# Patient Record
Sex: Male | Born: 1950 | Race: White | Hispanic: No | Marital: Married | Smoking: Never smoker
Health system: Southern US, Community
[De-identification: ages and names within clinical notes are randomized; demographics above are authoritative.]

## PROBLEM LIST (undated history)

## (undated) DIAGNOSIS — I10 Essential (primary) hypertension: Secondary | ICD-10-CM

## (undated) DIAGNOSIS — U071 COVID-19: Secondary | ICD-10-CM

## (undated) DIAGNOSIS — Z87442 Personal history of urinary calculi: Secondary | ICD-10-CM

## (undated) DIAGNOSIS — K2270 Barrett's esophagus without dysplasia: Secondary | ICD-10-CM

## (undated) HISTORY — DX: Barrett's esophagus without dysplasia: K22.70

## (undated) HISTORY — PX: HERNIA REPAIR: SHX51

---

## 1996-12-31 HISTORY — PX: BACK SURGERY: SHX140

## 2002-02-15 ENCOUNTER — Emergency Department (HOSPITAL_COMMUNITY): Admission: EM | Admit: 2002-02-15 | Discharge: 2002-02-15 | Payer: Self-pay | Admitting: Emergency Medicine

## 2003-08-14 ENCOUNTER — Emergency Department (HOSPITAL_COMMUNITY): Admission: EM | Admit: 2003-08-14 | Discharge: 2003-08-14 | Payer: Self-pay | Admitting: Emergency Medicine

## 2003-08-16 ENCOUNTER — Ambulatory Visit (HOSPITAL_COMMUNITY): Admission: RE | Admit: 2003-08-16 | Discharge: 2003-08-16 | Payer: Self-pay | Admitting: Internal Medicine

## 2006-05-24 ENCOUNTER — Emergency Department (HOSPITAL_COMMUNITY): Admission: EM | Admit: 2006-05-24 | Discharge: 2006-05-24 | Payer: Self-pay | Admitting: Emergency Medicine

## 2007-04-30 ENCOUNTER — Ambulatory Visit: Payer: Self-pay | Admitting: Internal Medicine

## 2007-05-19 ENCOUNTER — Encounter: Payer: Self-pay | Admitting: Internal Medicine

## 2007-05-19 ENCOUNTER — Ambulatory Visit: Payer: Self-pay | Admitting: Internal Medicine

## 2007-05-19 ENCOUNTER — Ambulatory Visit (HOSPITAL_COMMUNITY): Admission: RE | Admit: 2007-05-19 | Discharge: 2007-05-19 | Payer: Self-pay | Admitting: Internal Medicine

## 2009-07-24 ENCOUNTER — Emergency Department (HOSPITAL_COMMUNITY): Admission: EM | Admit: 2009-07-24 | Discharge: 2009-07-24 | Payer: Self-pay | Admitting: Emergency Medicine

## 2011-05-18 NOTE — Op Note (Signed)
NAME:  Cesar Harris, Cesar Harris                ACCOUNT NO.:  [id number]   MEDICAL RECORD NO.:  [id number]          PATIENT TYPE:  AMB   LOCATION:  DAY                           FACILITY:  [facility]   PHYSICIAN:  R. Roetta Sessions, M.D. DATE OF BIRTH:  11/24/1951   DATE OF PROCEDURE:  05/19/2007  DATE OF DISCHARGE:                               OPERATIVE REPORT   PROCEDURE:  Colonoscopy, snare polypectomy, lesion ablation.   INDICATIONS FOR PROCEDURE:  The patient is a 60 year old gentleman sent  over at the courtesy of Dr.  __________, for colorectal cancer  screening.  He has no lower GI tract symptoms.  He has never had his  lower GI tract evaluated.  There is no family history of colorectal  neoplasia.  Colonoscopy is now being done.  This was discussed with the  patient at length.  Potential risks, benefits, and alternatives have  been reviewed.  Questions were answered and he was agreeable.  Please  see documentation in medical record.   PROCEDURE NOTE:  O2 saturation, blood pressure, pulse oximetry monitored  throughout the entire procedure.  Conscious sedation with versed 3 mg  IV, Demerol 75 mg IV in divided doses.   INSTRUMENT:  Pentax video chip system.   FINDINGS:  Digital rectal exam revealed no abnormalities.   ENDOSCOPIC FINDINGS:  Prep was adequate.   The colonic mucosa was surveyed from the rectosigmoid junction to the  left transverse and right colon, the appendiceal orifice, the ileocecal  valve, and cecum.  These structures were well seen and photographed for  the record from the ___________ mucosal surfaces were seen.  The patient  had an 8-mm polyp on a stalk at the hepatic flexure; it was hot snared  and recovered with the scope.  There was a 3-mm mid descending colon  polyp which was ablated with the tip of the hot snare cautery.  There  was a 1.25-cm polyp at 25 cm (rectosigmoid) which was removed with not  snare and recovered with the scope.  Finally, there was a 5-mm  rectal  polyp seen at 5 cm from the anal verge which was hot snared.  The  remainder of the rectal mucosa included a retroflexed view of the anal  verge that demonstrated no abnormalities.  The remainder of the colonic  mucosa appeared normal.  The patient tolerated the procedure well and  was reacted from endoscopy.   IMPRESSION:  Multiple rectal and colonic polyps, destroyed and/or  removed as described above.   RECOMMENDATIONS:  1. No aspirin or antiinflammatory medications for 10 days.  2. Follow up on path.  3. Further recommendations to follow.      Jonathon Bellows, M.D.  Electronically Signed     RMR/MEDQ  D:  05/19/2007  T:  05/19/2007  Job:  [id number]   cc:   __________, MD

## 2011-05-18 NOTE — Op Note (Signed)
NAME:  Cesar Harris, Cesar Harris                          ACCOUNT NO.:  [id number]   MEDICAL RECORD NO.:  [id number]                   PATIENT TYPE:  AMB   LOCATION:  DAY                                  FACILITY:  [facility]   PHYSICIAN:  R. Roetta Sessions, M.D.              DATE OF BIRTH:  10-21-1951   DATE OF PROCEDURE:  DATE OF DISCHARGE:                                 OPERATIVE REPORT   PROCEDURE:  Esophagogastroduodenoscopy with Maloney dilation.   ENDOSCOPIST:  Gerrit Friends. Rourk, M.D.   INDICATIONS FOR PROCEDURE:  The patient is a 60 year old gentleman with  longstanding frequent reflux symptoms he describes as heartburn with a  several month history of present illness intermittent esophageal dysphagia  to solids.  The frequency of dysphagia episodes have increased over the past  couple of months, in fact, he felt that he had a piece of food stuck in his  esophagus on August 14, 2003.  He presented to the emergency department,  here, and saw Dr. Rosalia Hammers.  Dr. Rosalia Hammers evaluated him in the emergency department.  The foreign body sensation passed spontaneously.  She called me to set him  up for further evaluation.  EGD is now being done.  The potential risks,  benefits, and alternatives have been reviewed. The potential for esophageal  dilation has been discussed; questions answered.  Please see my handwritten  H&P.   PROCEDURE NOTE:  O2 saturation, blood pressure, pulse and respirations were  monitored throughout the entirety procedure.  Conscious sedation: Versed 3  mg IV, Demerol 75 mg IV in divided doses, Cetacaine spray for topical  oropharyngeal anesthesia.   INSTRUMENT:  Olympus video chip adult gastroscope.   FINDINGS:  Examination of the tubular esophagus revealed a Schatzki ring.  Also there were multiple esophageal erosions coming up in a geographic  distribution involving the distal one-third of the tubular esophagus.  There was no evidence of Barrett's esophagus or cancer.  The EG  junction was  easily traversed.   STOMACH:  The gastric cavity was empty.  It insufflated well with air.  A  thorough examination of the gastric mucosa including a retroflex view of the  proximal stomach and esophagogastric junction demonstrated only a small  hiatal hernia and a couple of tiny antral erosions.  The pylorus was patent  and easily traversed.   Examination of the bulb and the second portion revealed no abnormalities.   THERAPEUTIC/DIAGNOSTIC MANEUVERS:  A 56 French Maloney dilator was passed to  full insertion with good patient tolerance and without blood return on the  dilator.  A look back revealed the ring had been ruptured without apparent  complication.   The patient tolerated the procedure well and was reacted in endoscopy.   IMPRESSION:  1. Multiple distal esophageal erosions as described above, consistent with     moderately severe erosive reflux esophagitis.  2. Schatzki's ring status post dilation as  described above.  3. Small hiatal hernia.  4. Tiny antral erosions of doubtful clinical significance.  The remainder of     the gastric mucosa and D1 and D2 appeared normal.   RECOMMENDATIONS:  1. Antireflux measures emphasized.  2. Gastroesophageal reflux disease literature provided to the patient.  3. Begin Aciphex 20 mg orally daily, each morning before breakfast.   PLAN:  Plan to see him back in the office in 4 weeks to assess his progress.                                               Cesar Harris, M.D.    RMR/MEDQ  D:  08/16/2003  T:  08/16/2003  Job:  [id number]   cc:   Hilario Quarry, M.D.  [address]  [place]  [place] [address]  Fax: [phone]

## 2011-05-18 NOTE — Consult Note (Signed)
NAME:  Cesar Harris, Cesar Harris                ACCOUNT NO.:  [id number]   MEDICAL RECORD NO.:  [id number]          PATIENT TYPE:  AMB   LOCATION:                                FACILITY:  [facility]   PHYSICIAN:  R. Roetta Sessions, M.D. DATE OF BIRTH:  07-Jul-1951   DATE OF CONSULTATION:  DATE OF DISCHARGE:                                 CONSULTATION   CHIEF COMPLAINT:  Screening colonoscopy.   HISTORY OF PRESENT ILLNESS:  Cesar Harris is a 60 year old Caucasian male.  He has never had a screening colonoscopy.  He presents today without any  GI complaints for screening exam.  He has history of chronic GERD and  dysphagia, followed by Cesar Harris, and had EGD in 2004 which was shown to  have distal esophageal erosions and small hiatal hernia and a Schatzki's  Ring which was dilated.  He has done well since that time.  No further  GERD symptoms, and he has not been on PPI.  Denies any dysphagia,  odynophagia, anorexia or early satiety.  Denies any rectal bleeding or  melena.  Denies any abdominal pain.  Denies any change in bowel habits.   PAST MEDICAL HISTORY:  1. Hypertension.  2. Bilateral inguinal hernia repairs.  3. L4-5 disk surgery.  4. Erosive reflux esophagitis found on EGD along with a small hiatal      hernia and Schatzki's Ring by Cesar Harris on August 16, 2003.   CURRENT MEDICATIONS:  1. K-Ciel 40 mEq daily.  2. Indomethacin 1-2 t.i.d.  3  Amlodipine/benazepril 5/10 mg daily.  1. Hydrochlorothiazide 25 mg daily.  2. Goody's p.r.n.   ALLERGIES:  No known drug allergies.   FAMILY HISTORY:  There is no known family history of colorectal  carcinoma, liver or chronic GI problems.   SOCIAL HISTORY:  Cesar Harris is married.  He has two grown healthy  children.  He is employed with [employer].  He denies tobacco, alcohol or  drug use.   REVIEW OF SYSTEMS:  CONSTITUTIONAL:  Weight stable.  Denies any fatigue.  CARDIOVASCULAR:  Denies any chest pain or palpitations. PULMONOLOGY:  Denies any  cough, shortness of breath, dyspnea or hemoptysis.  GI:  See  HPI.   PHYSICAL EXAMINATION:  VITAL SIGNS:  Weight 231 pounds, height 72  inches, temperature 97.7, blood pressure 110/70, pulse 78.  GENERAL:  Cesar Harris is a well-developed, well-nourished, obese,  Caucasian male in no acute distress.  HEENT:  Sclerae are clear, nonicteric.  Conjunctivae are pink.  Oropharynx pink and moist without any lesions.  NECK:  Supple without any mass or thyromegaly.  CHEST:  Heart regular rate and rhythm with normal S1, S2 without any  murmurs, clicks, rubs or gallops.  LUNGS:  Clear to auscultation bilaterally.  ABDOMEN:  Positive bowel sounds x4, no bruits auscultated, soft,  nontender, nondistended with no palpable mass or hepatosplenomegaly.  No  rebound tenderness or guarding.  EXTREMITIES:  No clubbing or edema.  SKIN:  Pink, warm and dry without any rash or jaundice.   IMPRESSION:  Cesar Harris is a 60 year old Caucasian male who  is here to  accept screening colonoscopy.  He denies any GI complaints at this time.  He also has history of erosive reflux esophagitis and Schatzki's Ring,  has done well since Cesar Harris dilatation in 2004 by Cesar Harris.  He is  currently not on PPI.   PLAN:  Screening colonoscopy with Dr.  Jena Harris in the near future.  I  discussed the procedure including risks and benefits including but not  limited to bleeding, infection, perforation, drug reaction.  He agrees  with plan and consent will be obtained.   We would like to thank Dr. Regino Schultze for allowing [place] to participate in the  care of Cesar Harris.      Nicholas Lose, N.P.      Jonathon Bellows, M.D.  Electronically Signed    KC/MEDQ  D:  04/30/2007  T:  04/30/2007  Job:  [id number]

## 2012-03-25 ENCOUNTER — Encounter (HOSPITAL_COMMUNITY): Payer: Self-pay

## 2012-03-27 ENCOUNTER — Encounter (HOSPITAL_COMMUNITY)
Admission: RE | Admit: 2012-03-27 | Discharge: 2012-03-27 | Disposition: A | Discharge status: Home / Self Care | Payer: BC Managed Care – PPO | Source: Ambulatory Visit | Attending: Ophthalmology | Admitting: Ophthalmology

## 2012-03-27 ENCOUNTER — Other Ambulatory Visit: Payer: Self-pay

## 2012-03-27 ENCOUNTER — Encounter (HOSPITAL_COMMUNITY): Payer: Self-pay

## 2012-03-27 HISTORY — DX: Essential (primary) hypertension: I10

## 2012-03-27 LAB — BASIC METABOLIC PANEL
BUN: 20 mg/dL (ref 6–23)
Chloride: 105 mEq/L (ref 96–112)
Creatinine, Ser: 1.13 mg/dL (ref 0.50–1.35)
GFR calc non Af Amer: 68 mL/min — ABNORMAL LOW (ref >90)
Sodium: 138 mEq/L (ref 135–145)

## 2012-03-27 NOTE — Patient Instructions (Signed)
20 Cesar Harris  03/27/2012   Your procedure is scheduled on:  Thursday, 04/03/12  Report to [facility] at 0940 AM.  Call this number if you have problems the morning of surgery: [phone]   Remember:   Do not eat food:After Midnight.  May have clear liquids:until Midnight .  Clear liquids include soda, tea, black coffee, apple or grape juice, broth.  Take these medicines the morning of surgery with A SIP OF WATER: lotrel   Do not wear jewelry, make-up or nail polish.  Do not wear lotions, powders, or perfumes. You may wear deodorant.  Do not bring valuables to the hospital.  Contacts, dentures or bridgework may not be worn into surgery.     Patients discharged the day of surgery will not be allowed to drive home.  Name and phone number of your driver: driver  Special Instructions: Use eye drops as instructed.   Please read over the following fact sheets that you were given: Pain Booklet, Anesthesia Post-op Instructions and Care and Recovery After Surgery   PATIENT INSTRUCTIONS POST-ANESTHESIA  IMMEDIATELY FOLLOWING SURGERY:  Do not drive or operate machinery for the first twenty four hours after surgery.  Do not make any important decisions for twenty four hours after surgery or while taking narcotic pain medications or sedatives.  If you develop intractable nausea and vomiting or a severe headache please notify your doctor immediately.  FOLLOW-UP:  Please make an appointment with your surgeon as instructed. You do not need to follow up with anesthesia unless specifically instructed to do so.  WOUND CARE INSTRUCTIONS (if applicable):  Keep a dry clean dressing on the anesthesia/puncture wound site if there is drainage.  Once the wound has quit draining you may leave it open to air.  Generally you should leave the bandage intact for twenty four hours unless there is drainage.  If the epidural site drains for more than 36-48 hours please call the anesthesia department.  QUESTIONS?:   Please feel free to call your physician or the hospital operator if you have any questions, and they will be happy to assist you.         Cataract A cataract is a clouding of the lens of the eye. When a lens becomes cloudy, vision is reduced based on the degree and nature of the clouding. Many cataracts reduce vision to some degree. Some cataracts make people more near-sighted as they develop. Other cataracts increase glare. Cataracts that are ignored and become worse can sometimes look white. The white color can be seen through the pupil. CAUSES   Aging. However, cataracts may occur at any age, even in newborns.   Certain drugs.   Trauma to the eye.   Certain diseases such as diabetes.   Specific eye diseases such as chronic inflammation inside the eye or a sudden attack of a rare form of glaucoma.   Inherited or acquired medical problems.  SYMPTOMS   Gradual, progressive drop in vision in the affected eye.   Severe, rapid visual loss. This most often happens when trauma is the cause.  DIAGNOSIS  To detect a cataract, an eye doctor examines the lens. Cataracts are best diagnosed with an exam of the eyes with the pupils enlarged (dilated) by drops.  TREATMENT  For an early cataract, vision may improve by using different eyeglasses or stronger lighting. If that does not help your vision, surgery is the only effective treatment. A cataract needs to be surgically removed when vision loss interferes  with your everyday activities, such as driving, reading, or watching TV. A cataract may also have to be removed if it prevents examination or treatment of another eye problem. Surgery removes the cloudy lens and usually replaces it with a substitute lens (intraocular lens, IOL).  At a time when both you and your doctor agree, the cataract will be surgically removed. If you have cataracts in both eyes, only one is usually removed at a time. This allows the operated eye to heal and be out of  danger from any possible problems after surgery (such as infection or poor wound healing). In rare cases, a cataract may be doing damage to your eye. In these cases, your caregiver may advise surgical removal right away. The vast majority of people who have cataract surgery have better vision afterward. HOME CARE INSTRUCTIONS  If you are not planning surgery, you may be asked to do the following:  Use different eyeglasses.   Use stronger or brighter lighting.   Ask your eye doctor about reducing your medicine dose or changing medicines if it is thought that a medicine caused your cataract. Changing medicines does not make the cataract go away on its own.   Become familiar with your surroundings. Poor vision can lead to injury. Avoid bumping into things on the affected side. You are at a higher risk for tripping or falling.   Exercise extreme care when driving or operating machinery.   Wear sunglasses if you are sensitive to bright light or experiencing problems with glare.  SEEK IMMEDIATE MEDICAL CARE IF:   You have a worsening or sudden vision loss.   You notice redness, swelling, or increasing pain in the eye.   You have a fever.  Document Released: 12/17/2005 Document Revised: 12/06/2011 Document Reviewed: 08/10/2011 [facility] Patient Information 2012 [place], [place].

## 2012-04-02 MED ORDER — TETRACAINE HCL 0.5 % OP SOLN
OPHTHALMIC | Status: AC
  Filled 2012-04-02: qty 2

## 2012-04-02 MED ORDER — NEOMYCIN-POLYMYXIN-DEXAMETH 3.5-10000-0.1 OP OINT
TOPICAL_OINTMENT | OPHTHALMIC | Status: AC
  Filled 2012-04-02: qty 3.5

## 2012-04-02 MED ORDER — LIDOCAINE HCL (PF) 1 % IJ SOLN
INTRAMUSCULAR | Status: AC
  Filled 2012-04-02: qty 2

## 2012-04-02 MED ORDER — CYCLOPENTOLATE-PHENYLEPHRINE 0.2-1 % OP SOLN
OPHTHALMIC | Status: AC
  Filled 2012-04-02: qty 2

## 2012-04-02 MED ORDER — LIDOCAINE HCL 3.5 % OP GEL
OPHTHALMIC | Status: AC
  Filled 2012-04-02: qty 5

## 2012-04-03 ENCOUNTER — Encounter (HOSPITAL_COMMUNITY)
Admission: RE | Disposition: A | Discharge status: Home / Self Care | Payer: Self-pay | Source: Ambulatory Visit | Attending: Ophthalmology

## 2012-04-03 ENCOUNTER — Ambulatory Visit (HOSPITAL_COMMUNITY)
Admission: RE | Admit: 2012-04-03 | Discharge: 2012-04-03 | Disposition: A | Discharge status: Home / Self Care | Payer: BC Managed Care – PPO | Source: Ambulatory Visit | Attending: Ophthalmology | Admitting: Ophthalmology

## 2012-04-03 ENCOUNTER — Encounter (HOSPITAL_COMMUNITY): Payer: Self-pay | Admitting: Anesthesiology

## 2012-04-03 ENCOUNTER — Ambulatory Visit (HOSPITAL_COMMUNITY): Payer: BC Managed Care – PPO | Admitting: Anesthesiology

## 2012-04-03 ENCOUNTER — Encounter (HOSPITAL_COMMUNITY): Payer: Self-pay | Admitting: *Deleted

## 2012-04-03 DIAGNOSIS — IMO0002 Reserved for concepts with insufficient information to code with codable children: Secondary | ICD-10-CM | POA: Insufficient documentation

## 2012-04-03 DIAGNOSIS — Z79899 Other long term (current) drug therapy: Secondary | ICD-10-CM | POA: Insufficient documentation

## 2012-04-03 DIAGNOSIS — I10 Essential (primary) hypertension: Secondary | ICD-10-CM | POA: Insufficient documentation

## 2012-04-03 HISTORY — PX: CATARACT EXTRACTION W/PHACO: SHX586

## 2012-04-03 SURGERY — PHACOEMULSIFICATION, CATARACT, WITH IOL INSERTION
Anesthesia: Monitor Anesthesia Care | Site: Eye | Laterality: Left | Wound class: Clean

## 2012-04-03 MED ORDER — POVIDONE-IODINE 5 % OP SOLN
OPHTHALMIC | Status: DC | PRN
  Administered 2012-04-03: 1 via OPHTHALMIC

## 2012-04-03 MED ORDER — LACTATED RINGERS IV SOLN
INTRAVENOUS | Status: DC
  Administered 2012-04-03: 11:00:00 via INTRAVENOUS

## 2012-04-03 MED ORDER — LIDOCAINE 3.5 % OP GEL OPTIME - NO CHARGE
OPHTHALMIC | Status: DC | PRN
  Administered 2012-04-03: 1 [drp] via OPHTHALMIC

## 2012-04-03 MED ORDER — PROVISC 10 MG/ML IO SOLN
INTRAOCULAR | Status: DC | PRN
  Administered 2012-04-03: .85 mL via INTRAOCULAR

## 2012-04-03 MED ORDER — LIDOCAINE HCL (PF) 1 % IJ SOLN
INTRAMUSCULAR | Status: DC | PRN
  Administered 2012-04-03: .6 mL

## 2012-04-03 MED ORDER — ONDANSETRON HCL 4 MG/2ML IJ SOLN: 4.0000 mg | Freq: Once | INTRAMUSCULAR | Status: DC | PRN

## 2012-04-03 MED ORDER — NEOMYCIN-POLYMYXIN-DEXAMETH 0.1 % OP OINT
TOPICAL_OINTMENT | OPHTHALMIC | Status: DC | PRN
  Administered 2012-04-03: 1 via OPHTHALMIC

## 2012-04-03 MED ORDER — BSS IO SOLN
INTRAOCULAR | Status: DC | PRN
  Administered 2012-04-03: 15 mL via INTRAOCULAR

## 2012-04-03 MED ORDER — MIDAZOLAM HCL 2 MG/2ML IJ SOLN
1.0000 mg | INTRAMUSCULAR | Status: DC | PRN
  Administered 2012-04-03: 2 mg via INTRAVENOUS

## 2012-04-03 MED ORDER — EPINEPHRINE HCL 1 MG/ML IJ SOLN
INTRAMUSCULAR | Status: AC
  Filled 2012-04-03: qty 1

## 2012-04-03 MED ORDER — PHENYLEPHRINE HCL 2.5 % OP SOLN
1.0000 [drp] | OPHTHALMIC | Status: AC
  Administered 2012-04-03 (×3): 1 [drp] via OPHTHALMIC

## 2012-04-03 MED ORDER — EPINEPHRINE HCL 1 MG/ML IJ SOLN
INTRAOCULAR | Status: DC | PRN
  Administered 2012-04-03: 12:00:00

## 2012-04-03 MED ORDER — MIDAZOLAM HCL 2 MG/2ML IJ SOLN
INTRAMUSCULAR | Status: AC
  Filled 2012-04-03: qty 2

## 2012-04-03 MED ORDER — FENTANYL CITRATE 0.05 MG/ML IJ SOLN: 25.0000 ug | INTRAMUSCULAR | Status: DC | PRN

## 2012-04-03 MED ORDER — LIDOCAINE HCL 3.5 % OP GEL: 1.0000 "application " | Freq: Once | OPHTHALMIC | Status: DC

## 2012-04-03 MED ORDER — TRYPAN BLUE 0.06 % OP SOLN
OPHTHALMIC | Status: AC
  Filled 2012-04-03: qty 0.5

## 2012-04-03 MED ORDER — PHENYLEPHRINE HCL 2.5 % OP SOLN
OPHTHALMIC | Status: AC
  Filled 2012-04-03: qty 2

## 2012-04-03 MED ORDER — TETRACAINE HCL 0.5 % OP SOLN
1.0000 [drp] | OPHTHALMIC | Status: AC
  Administered 2012-04-03 (×3): 1 [drp] via OPHTHALMIC

## 2012-04-03 MED ORDER — CYCLOPENTOLATE-PHENYLEPHRINE 0.2-1 % OP SOLN
1.0000 [drp] | OPHTHALMIC | Status: AC
  Administered 2012-04-03 (×3): 1 [drp] via OPHTHALMIC

## 2012-04-03 SURGICAL SUPPLY — 32 items

## 2012-04-03 NOTE — Discharge Instructions (Signed)
LYRIQ JARCHOW  04/03/2012     Instructions  1. Use medications as Instructed.  Shake well before use. Wait 5 minutes between drops.  {OPHTHALMIC ANTIBIOTICS:22167} 4 times a day x 1 week.  {OPHTHALMIC ANTI-INFLAMMATORY:22168} 2 times a day x 4 weeks.  {OPHTHALMIC STEROID:22169} 4 times a day - week 1   3 times a day - Week 2, 2 times a day- Week 3, 1 time a day - Week 4.  2. Do not rub the operative eye. Do not swim underwater for 2 weeks.  3. You may remove the clear shield and resume your normal activities the day after  Surgery. Your eyes may feel more comfortable if you wear dark glasses outside.  4. Call our office at [phone] if you have sudden change in vision, extreme redness or pain. Some fluctuation in vision is normal after surgery. If you have an emergency after hours, call Dr. Alto Denver at [phone].  5. It is important that you attend all of your follow-up appointments.        Follow-up:{follow up:32580} with Gemma Payor, MD.   Dr. Lahoma Crocker: [phone]  Dr. Lita Mains: [phone]  Dr. Alto Denver: [phone]   If you find that you cannot contact your physician, but feel that your signs and   Symptoms warrant a physician's attention, call the Emergency Room at   [phone].   Other{NA AND WNUUVOZD:66440}.

## 2012-04-03 NOTE — Transfer of Care (Signed)
Immediate Anesthesia Transfer of Care Note  Patient: Cesar Harris  Procedure(s) Performed: Procedure(s) (LRB): CATARACT EXTRACTION PHACO AND INTRAOCULAR LENS PLACEMENT (IOC) (Left)  Patient Location: PACU and Short Stay  Anesthesia Type: MAC  Level of Consciousness: awake, alert  and oriented  Airway & Oxygen Therapy: Patient Spontanous Breathing  Post-op Assessment: Report given to PACU RN, Post -op Vital signs reviewed and stable and Patient moving all extremities  Post vital signs: Reviewed and stable  Complications: No apparent anesthesia complications

## 2012-04-03 NOTE — Anesthesia Postprocedure Evaluation (Signed)
  Anesthesia Post-op Note  Patient: Cesar Harris  Procedure(s) Performed: Procedure(s) (LRB): CATARACT EXTRACTION PHACO AND INTRAOCULAR LENS PLACEMENT (IOC) (Left)  Patient Location: PACU and Short Stay  Anesthesia Type: MAC  Level of Consciousness: awake, alert , oriented and patient cooperative  Airway and Oxygen Therapy: Patient Spontanous Breathing  Post-op Pain: none  Post-op Assessment: Post-op Vital signs reviewed, Patient's Cardiovascular Status Stable, Respiratory Function Stable, Patent Airway and No signs of Nausea or vomiting  Post-op Vital Signs: Reviewed and stable  Complications: No apparent anesthesia complications

## 2012-04-03 NOTE — H&P (Signed)
I have reviewed the H&P, the patient was re-examined, and I have identified no interval changes in medical condition and plan of care since the history and physical of record  

## 2012-04-03 NOTE — Op Note (Signed)
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  OPERATION PERFORMED:  Phacoemulsification with posterior chamber intraocular lens implantation, left eye.  SURGEON:  Bonne Dolores. Delbert Vu, MD  ANESTHESIA:  General endotracheal anesthesia.  OPERATIVE SUMMARY:  In the preoperative area, dilating drops were placed into the left eye.  The patient was then brought into the operating room where he was placed under general anesthesia.  The eye was then prepped and draped.  Beginning with a 75 blade, a paracentesis port was made at the surgeon's 2 o'clock position.  The anterior chamber was then filled with a 1% nonpreserved lidocaine solution with epinephrine.  This was followed by Viscoat to deepen the chamber.  A small fornix-based peritomy was performed superiorly.  Next, a single iris hook was placed through the limbus superiorly.  A 2.4-mm keratome blade was then used to make a clear corneal incision over the iris hook.  A bent cystotome needle and Utrata forceps were used to create a continuous tear capsulotomy.  Hydrodissection was performed using balanced salt solution on a fine cannula.  The lens nucleus was then removed using phacoemulsification in a quadrant cracking technique.  The cortical material was then removed with irrigation and aspiration.  The capsular bag and anterior chamber were refilled with Provisc.  The wound was widened to approximately 3 mm and a posterior chamber intraocular lens was placed into the capsular  bag without difficulty using an [employer] lens injecting system.  A single 10-0 nylon suture was then used to close the incision as well as stromal hydration.  The Provisc was removed from the anterior chamber and capsular bag with irrigation and aspiration.  At this point, the wounds were tested for leak, which were negative.  The anterior chamber remained deep and stable.  The patient tolerated the procedure well.  There were no operative complications, and he awoke from general anesthesia without problem.  No surgical specimens.  Prosthetic device used is a Cabin crew posterior chamber lens, model MX60, power of 20.5, serial number is [id number].          ______________________________ Susanne Greenhouse, MD     KEH/MEDQ  D:  04/03/2012  T:  04/03/2012  Job:  [id number]

## 2012-04-03 NOTE — OR Nursing (Signed)
H & H not performed by lab due to clot.  Per Dr. Jayme Cloud don,t repeat H&H

## 2012-04-03 NOTE — Brief Op Note (Signed)
Pre-Op Dx: Cataract OS Post-Op Dx: Cataract OS Surgeon: Tarrance Januszewski Anesthesia: Topical with MAC Implant: Lenstec, Model Softec HD Specimen: None Complications: None 

## 2012-04-03 NOTE — Anesthesia Preprocedure Evaluation (Addendum)
Anesthesia Evaluation  Patient identified by MRN, date of birth, ID band Patient awake    Reviewed: Allergy & Precautions, H&P , NPO status , Patient's Chart, lab work & pertinent test results  Airway Mallampati: I      Dental  (+) Teeth Intact   Pulmonary  breath sounds clear to auscultation        Cardiovascular hypertension, Pt. on medications Rhythm:Regular Rate:Normal     Neuro/Psych    GI/Hepatic   Endo/Other    Renal/GU Renal disease (stones, hx Gout)     Musculoskeletal   Abdominal   Peds  Hematology   Anesthesia Other Findings   Reproductive/Obstetrics                           Anesthesia Physical Anesthesia Plan  ASA: II  Anesthesia Plan: MAC   Post-op Pain Management:    Induction: Intravenous  Airway Management Planned: Nasal Cannula  Additional Equipment:   Intra-op Plan:   Post-operative Plan:   Informed Consent: I have reviewed the patients History and Physical, chart, labs and discussed the procedure including the risks, benefits and alternatives for the proposed anesthesia with the patient or authorized representative who has indicated his/her understanding and acceptance.     Plan Discussed with:   Anesthesia Plan Comments:         Anesthesia Quick Evaluation

## 2012-04-07 ENCOUNTER — Encounter (HOSPITAL_COMMUNITY): Payer: Self-pay | Admitting: Ophthalmology

## 2015-11-07 NOTE — Patient Instructions (Signed)
Your procedure is scheduled on:  11/14/2015  Report to [facility] at    10:00  AM.  Call this number if you have problems the morning of surgery: [phone]   Remember:   Do not eat or drink :After Midnight.    Take these medicines the morning of surgery with A SIP OF WATER: Lotrel and indocin   Do not wear jewelry, make-up or nail polish.  Do not wear lotions, powders, or perfumes. You may wear deodorant.  Do not shave 48 hours prior to surgery.  Do not bring valuables to the hospital.  Contacts, dentures or bridgework may not be worn into surgery.  Patients discharged the day of surgery will not be allowed to drive home.  Name and phone number of your driver:    Please read over the following fact sheets that you were given: Pain Booklet, Surgical Site Infection Prevention, Anesthesia Post-op Instructions and Care and Recovery After Surgery  Cataract Surgery  A cataract is a clouding of the lens of the eye. When a lens becomes cloudy, vision is reduced based on the degree and nature of the clouding. Surgery may be needed to improve vision. Surgery removes the cloudy lens and usually replaces it with a substitute lens (intraocular lens, IOL). LET YOUR EYE DOCTOR KNOW ABOUT:  Allergies to food or medicine.   Medicines taken including herbs, eyedrops, over-the-counter medicines, and creams.   Use of steroids (by mouth or creams).   Previous problems with anesthetics or numbing medicine.   History of bleeding problems or blood clots.   Previous surgery.   Other health problems, including diabetes and kidney problems.   Possibility of pregnancy, if this applies.  RISKS AND COMPLICATIONS  Infection.   Inflammation of the eyeball (endophthalmitis) that can spread to both eyes (sympathetic ophthalmia).   Poor wound healing.   If an IOL is inserted, it can later fall out of proper position. This is very uncommon.   Clouding of the part of your eye that holds an IOL in  place. This is called an "after-cataract." These are uncommon, but easily treated.  BEFORE THE PROCEDURE  Do not eat or drink anything except small amounts of water for 8 to 12 before your surgery, or as directed by your caregiver.   Unless you are told otherwise, continue any eyedrops you have been prescribed.   Talk to your primary caregiver about all other medicines that you take (both prescription and non-prescription). In some cases, you may need to stop or change medicines near the time of your surgery. This is most important if you are taking blood-thinning medicine.Do not stop medicines unless you are told to do so.   Arrange for someone to drive you to and from the procedure.   Do not put contact lenses in either eye on the day of your surgery.  PROCEDURE There is more than one method for safely removing a cataract. Your doctor can explain the differences and help determine which is best for you. Phacoemulsification surgery is the most common form of cataract surgery.  An injection is given behind the eye or eyedrops are given to make this a painless procedure.   A small cut (incision) is made on the edge of the clear, dome-shaped surface that covers the front of the eye (cornea).   A tiny probe is painlessly inserted into the eye. This device gives off ultrasound waves that soften and break up the cloudy center of the lens. This makes it  easier for the cloudy lens to be removed by suction.   An IOL may be implanted.   The normal lens of the eye is covered by a clear capsule. Part of that capsule is intentionally left in the eye to support the IOL.   Your surgeon may or may not use stitches to close the incision.  There are other forms of cataract surgery that require a larger incision and stiches to close the eye. This approach is taken in cases where the doctor feels that the cataract cannot be easily removed using phacoemulsification. AFTER THE PROCEDURE  When an IOL is  implanted, it does not need care. It becomes a permanent part of your eye and cannot be seen or felt.   Your doctor will schedule follow-up exams to check on your progress.   Review your other medicines with your doctor to see which can be resumed after surgery.   Use eyedrops or take medicine as prescribed by your doctor.  Document Released: 12/06/2011 Document Reviewed: 12/03/2011 [facility] Patient Information 2012 [place].  .Cataract Surgery Care After Refer to this sheet in the next few weeks. These instructions provide you with information on caring for yourself after your procedure. Your caregiver may also give you more specific instructions. Your treatment has been planned according to current medical practices, but problems sometimes occur. Call your caregiver if you have any problems or questions after your procedure.  HOME CARE INSTRUCTIONS   Avoid strenuous activities as directed by your caregiver.   Ask your caregiver when you can resume driving.   Use eyedrops or other medicines to help healing and control pressure inside your eye as directed by your caregiver.   Only take over-the-counter or prescription medicines for pain, discomfort, or fever as directed by your caregiver.   Do not to touch or rub your eyes.   You may be instructed to use a protective shield during the first few days and nights after surgery. If not, wear sunglasses to protect your eyes. This is to protect the eye from pressure or from being accidentally bumped.   Keep the area around your eye clean and dry. Avoid swimming or allowing water to hit you directly in the face while showering. Keep soap and shampoo out of your eyes.   Do not bend or lift heavy objects. Bending increases pressure in the eye. You can walk, climb stairs, and do light household chores.   Do not put a contact lens into the eye that had surgery until your caregiver says it is okay to do so.   Ask your doctor when you can  return to work. This will depend on the kind of work that you do. If you work in a dusty environment, you may be advised to wear protective eyewear for a period of time.   Ask your caregiver when it will be safe to engage in sexual activity.   Continue with your regular eye exams as directed by your caregiver.  What to expect:  It is normal to feel itching and mild discomfort for a few days after cataract surgery. Some fluid discharge is also common, and your eye may be sensitive to light and touch.   After 1 to 2 days, even moderate discomfort should disappear. In most cases, healing will take about 6 weeks.   If you received an intraocular lens (IOL), you may notice that colors are very bright or have a blue tinge. Also, if you have been in bright sunlight, everything  may appear reddish for a few hours. If you see these color tinges, it is because your lens is clear and no longer cloudy. Within a few months after receiving an IOL, these extra colors should go away. When you have healed, you will probably need new glasses.  SEEK MEDICAL CARE IF:   You have increased bruising around your eye.   You have discomfort not helped by medicine.  SEEK IMMEDIATE MEDICAL CARE IF:   You have a fever.   You have a worsening or sudden vision loss.   You have redness, swelling, or increasing pain in the eye.   You have a thick discharge from the eye that had surgery.  MAKE SURE YOU:  Understand these instructions.   Will watch your condition.   Will get help right away if you are not doing well or get worse.  Document Released: 07/06/2005 Document Revised: 12/06/2011 Document Reviewed: 08/10/2011 [facility] Patient Information 2012 [place].    Monitored Anesthesia Care  Monitored anesthesia care is an anesthesia service for a medical procedure. Anesthesia is the loss of the ability to feel pain. It is produced by medications called anesthetics. It may affect a small area of your body  (local anesthesia), a large area of your body (regional anesthesia), or your entire body (general anesthesia). The need for monitored anesthesia care depends your procedure, your condition, and the potential need for regional or general anesthesia. It is often provided during procedures where:   General anesthesia may be needed if there are complications. This is because you need special care when you are under general anesthesia.   You will be under local or regional anesthesia. This is so that you are able to have higher levels of anesthesia if needed.   You will receive calming medications (sedatives). This is especially the case if sedatives are given to put you in a semi-conscious state of relaxation (deep sedation). This is because the amount of sedative needed to produce this state can be hard to predict. Too much of a sedative can produce general anesthesia. Monitored anesthesia care is performed by one or more caregivers who have special training in all types of anesthesia. You will need to meet with these caregivers before your procedure. During this meeting, they will ask you about your medical history. They will also give you instructions to follow. (For example, you will need to stop eating and drinking before your procedure. You may also need to stop or change medications you are taking.) During your procedure, your caregivers will stay with you. They will:   Watch your condition. This includes watching you blood pressure, breathing, and level of pain.   Diagnose and treat problems that occur.   Give medications if they are needed. These may include calming medications (sedatives) and anesthetics.   Make sure you are comfortable.  Having monitored anesthesia care does not necessarily mean that you will be under anesthesia. It does mean that your caregivers will be able to manage anesthesia if you need it or if it occurs. It also means that you will be able to have a different type  of anesthesia than you are having if you need it. When your procedure is complete, your caregivers will continue to watch your condition. They will make sure any medications wear off before you are allowed to go home.  Document Released: 09/12/2005 Document Revised: 04/13/2013 Document Reviewed: 01/28/2013 [facility] Patient Information 2014 [place], [place].

## 2015-11-08 ENCOUNTER — Encounter (HOSPITAL_COMMUNITY): Payer: Self-pay

## 2015-11-08 ENCOUNTER — Encounter (HOSPITAL_COMMUNITY)
Admission: RE | Admit: 2015-11-08 | Discharge: 2015-11-08 | Disposition: A | Discharge status: Home / Self Care | Payer: Medicare Other | Source: Ambulatory Visit | Attending: Ophthalmology | Admitting: Ophthalmology

## 2015-11-08 DIAGNOSIS — Z01818 Encounter for other preprocedural examination: Secondary | ICD-10-CM | POA: Insufficient documentation

## 2015-11-08 DIAGNOSIS — H2511 Age-related nuclear cataract, right eye: Secondary | ICD-10-CM | POA: Diagnosis not present

## 2015-11-08 HISTORY — DX: Personal history of urinary calculi: Z87.442

## 2015-11-08 LAB — CBC
HEMATOCRIT: 46.3 % (ref 39.0–52.0)
Hemoglobin: 15.7 g/dL (ref 13.0–17.0)
MCH: 28.9 pg (ref 26.0–34.0)
MCHC: 33.9 g/dL (ref 30.0–36.0)
MCV: 85.3 fL (ref 78.0–100.0)
Platelets: 180 10*3/uL (ref 150–400)
RBC: 5.43 MIL/uL (ref 4.22–5.81)
RDW: 12.9 % (ref 11.5–15.5)
WBC: 7.5 10*3/uL (ref 4.0–10.5)

## 2015-11-08 LAB — BASIC METABOLIC PANEL
ANION GAP: 8 (ref 5–15)
BUN: 17 mg/dL (ref 6–20)
CALCIUM: 9.5 mg/dL (ref 8.9–10.3)
CO2: 28 mmol/L (ref 22–32)
Chloride: 105 mmol/L (ref 101–111)
Creatinine, Ser: 1.32 mg/dL — ABNORMAL HIGH (ref 0.61–1.24)
GFR, EST NON AFRICAN AMERICAN: 55 mL/min — AB (ref >60)
Glucose, Bld: 108 mg/dL — ABNORMAL HIGH (ref 65–99)
Potassium: 4.1 mmol/L (ref 3.5–5.1)
SODIUM: 141 mmol/L (ref 135–145)

## 2015-11-08 NOTE — Pre-Procedure Instructions (Signed)
Patient given information to sign up for my chart at home. 

## 2015-11-14 ENCOUNTER — Encounter (HOSPITAL_COMMUNITY): Payer: Self-pay | Admitting: Ophthalmology

## 2015-11-14 ENCOUNTER — Ambulatory Visit (HOSPITAL_COMMUNITY): Payer: Medicare Other | Admitting: Anesthesiology

## 2015-11-14 ENCOUNTER — Ambulatory Visit (HOSPITAL_COMMUNITY)
Admission: RE | Admit: 2015-11-14 | Discharge: 2015-11-14 | Disposition: A | Discharge status: Home / Self Care | Payer: Medicare Other | Source: Ambulatory Visit | Attending: Ophthalmology | Admitting: Ophthalmology

## 2015-11-14 ENCOUNTER — Encounter (HOSPITAL_COMMUNITY)
Admission: RE | Disposition: A | Discharge status: Home / Self Care | Payer: Self-pay | Source: Ambulatory Visit | Attending: Ophthalmology

## 2015-11-14 DIAGNOSIS — M1990 Unspecified osteoarthritis, unspecified site: Secondary | ICD-10-CM | POA: Insufficient documentation

## 2015-11-14 DIAGNOSIS — H25811 Combined forms of age-related cataract, right eye: Secondary | ICD-10-CM | POA: Insufficient documentation

## 2015-11-14 DIAGNOSIS — I10 Essential (primary) hypertension: Secondary | ICD-10-CM | POA: Insufficient documentation

## 2015-11-14 DIAGNOSIS — Z79899 Other long term (current) drug therapy: Secondary | ICD-10-CM | POA: Insufficient documentation

## 2015-11-14 HISTORY — PX: CATARACT EXTRACTION W/PHACO: SHX586

## 2015-11-14 SURGERY — PHACOEMULSIFICATION, CATARACT, WITH IOL INSERTION
Anesthesia: Monitor Anesthesia Care | Site: Eye | Laterality: Right

## 2015-11-14 MED ORDER — LIDOCAINE HCL 3.5 % OP GEL
1.0000 "application " | Freq: Once | OPHTHALMIC | Status: AC
  Administered 2015-11-14: 1 via OPHTHALMIC

## 2015-11-14 MED ORDER — POVIDONE-IODINE 5 % OP SOLN
OPHTHALMIC | Status: DC | PRN
  Administered 2015-11-14: 1 via OPHTHALMIC

## 2015-11-14 MED ORDER — PHENYLEPHRINE HCL 2.5 % OP SOLN
1.0000 [drp] | OPHTHALMIC | Status: AC
  Administered 2015-11-14 (×3): 1 [drp] via OPHTHALMIC

## 2015-11-14 MED ORDER — NEOMYCIN-POLYMYXIN-DEXAMETH 3.5-10000-0.1 OP SUSP
OPHTHALMIC | Status: DC | PRN
  Administered 2015-11-14: 2 [drp] via OPHTHALMIC

## 2015-11-14 MED ORDER — MIDAZOLAM HCL 2 MG/2ML IJ SOLN
INTRAMUSCULAR | Status: AC
  Filled 2015-11-14: qty 2

## 2015-11-14 MED ORDER — LACTATED RINGERS IV SOLN
INTRAVENOUS | Status: DC
  Administered 2015-11-14: 10:00:00 via INTRAVENOUS

## 2015-11-14 MED ORDER — BSS IO SOLN
INTRAOCULAR | Status: DC | PRN
  Administered 2015-11-14: 15 mL via INTRAOCULAR

## 2015-11-14 MED ORDER — PROVISC 10 MG/ML IO SOLN
INTRAOCULAR | Status: DC | PRN
  Administered 2015-11-14: 0.85 mL via INTRAOCULAR

## 2015-11-14 MED ORDER — EPINEPHRINE HCL 1 MG/ML IJ SOLN
INTRAMUSCULAR | Status: AC
  Filled 2015-11-14: qty 2

## 2015-11-14 MED ORDER — EPINEPHRINE HCL 1 MG/ML IJ SOLN
INTRAOCULAR | Status: DC | PRN
  Administered 2015-11-14: 11:00:00

## 2015-11-14 MED ORDER — TETRACAINE HCL 0.5 % OP SOLN
1.0000 [drp] | OPHTHALMIC | Status: AC
  Administered 2015-11-14 (×3): 1 [drp] via OPHTHALMIC

## 2015-11-14 MED ORDER — LIDOCAINE HCL (PF) 1 % IJ SOLN
INTRAOCULAR | Status: DC | PRN
  Administered 2015-11-14: 11:00:00 via OPHTHALMIC

## 2015-11-14 MED ORDER — CYCLOPENTOLATE-PHENYLEPHRINE 0.2-1 % OP SOLN
1.0000 [drp] | OPHTHALMIC | Status: AC
  Administered 2015-11-14 (×3): 1 [drp] via OPHTHALMIC

## 2015-11-14 MED ORDER — MIDAZOLAM HCL 2 MG/2ML IJ SOLN
1.0000 mg | INTRAMUSCULAR | Status: DC | PRN
  Administered 2015-11-14: 2 mg via INTRAVENOUS

## 2015-11-14 SURGICAL SUPPLY — 33 items
CAPSULAR TENSION RING-AMO (OPHTHALMIC RELATED) IMPLANT
CLOTH BEACON ORANGE TIMEOUT ST (SAFETY) ×2 IMPLANT
EYE SHIELD UNIVERSAL CLEAR (GAUZE/BANDAGES/DRESSINGS) ×2 IMPLANT
GLOVE BIO SURGEON STRL SZ 6.5 (GLOVE) IMPLANT
GLOVE BIOGEL PI IND STRL 6.5 (GLOVE) IMPLANT
GLOVE BIOGEL PI IND STRL 7.0 (GLOVE) ×3 IMPLANT
GLOVE BIOGEL PI IND STRL 7.5 (GLOVE) IMPLANT
GLOVE BIOGEL PI INDICATOR 6.5 (GLOVE)
GLOVE BIOGEL PI INDICATOR 7.0 (GLOVE) ×3
GLOVE BIOGEL PI INDICATOR 7.5 (GLOVE)
GLOVE ECLIPSE 6.5 STRL STRAW (GLOVE) IMPLANT
GLOVE ECLIPSE 7.0 STRL STRAW (GLOVE) IMPLANT
GLOVE ECLIPSE 7.5 STRL STRAW (GLOVE) IMPLANT
GLOVE EXAM NITRILE LRG STRL (GLOVE) IMPLANT
GLOVE EXAM NITRILE MD LF STRL (GLOVE) IMPLANT
GLOVE SKINSENSE NS SZ6.5 (GLOVE)
GLOVE SKINSENSE NS SZ7.0 (GLOVE)
GLOVE SKINSENSE STRL SZ6.5 (GLOVE) IMPLANT
GLOVE SKINSENSE STRL SZ7.0 (GLOVE) IMPLANT
KIT VITRECTOMY (OPHTHALMIC RELATED) IMPLANT
PAD ARMBOARD 7.5X6 YLW CONV (MISCELLANEOUS) ×2 IMPLANT
PROC W NO LENS (INTRAOCULAR LENS)
PROC W SPEC LENS (INTRAOCULAR LENS)
PROCESS W NO LENS (INTRAOCULAR LENS) IMPLANT
PROCESS W SPEC LENS (INTRAOCULAR LENS) IMPLANT
RETRACTOR IRIS SIGHTPATH (OPHTHALMIC RELATED) IMPLANT
RING MALYGIN (MISCELLANEOUS) IMPLANT
SIGHTPATH CAT PROC W REG LENS (Ophthalmic Related) ×2 IMPLANT
SYRINGE LUER LOK 1CC (MISCELLANEOUS) ×2 IMPLANT
TAPE SURG TRANSPARENT 2IN (GAUZE/BANDAGES/DRESSINGS) ×1 IMPLANT
TAPE TRANSPARENT 2IN (GAUZE/BANDAGES/DRESSINGS) ×1
VISCOELASTIC ADDITIONAL (OPHTHALMIC RELATED) IMPLANT
WATER STERILE IRR 250ML POUR (IV SOLUTION) ×2 IMPLANT

## 2015-11-14 NOTE — Discharge Instructions (Signed)

## 2015-11-14 NOTE — Transfer of Care (Signed)
Immediate Anesthesia Transfer of Care Note  Patient: Cesar Harris  Procedure(s) Performed: Procedure(s) with comments: CATARACT EXTRACTION PHACO AND INTRAOCULAR LENS PLACEMENT RIGHT EYE (Right) - CDE:8.19  Patient Location: Short Stay  Anesthesia Type:MAC  Level of Consciousness: awake, alert , oriented and patient cooperative  Airway & Oxygen Therapy: Patient Spontanous Breathing  Post-op Assessment: Report given to RN and Post -op Vital signs reviewed and stable  Post vital signs: Reviewed and stable  Last Vitals:  Filed Vitals:   11/14/15 1030  Resp: 16    Complications: No apparent anesthesia complications

## 2015-11-14 NOTE — Anesthesia Postprocedure Evaluation (Signed)
  Anesthesia Post-op Note  Patient: Cesar Harris  Procedure(s) Performed: Procedure(s) with comments: CATARACT EXTRACTION PHACO AND INTRAOCULAR LENS PLACEMENT RIGHT EYE (Right) - CDE:8.19  Patient Location: Short Stay  Anesthesia Type:MAC  Level of Consciousness: awake, alert , oriented and patient cooperative  Airway and Oxygen Therapy: Patient Spontanous Breathing  Post-op Pain: none  Post-op Assessment: Post-op Vital signs reviewed, Patient's Cardiovascular Status Stable, Respiratory Function Stable, Patent Airway, No signs of Nausea or vomiting and Pain level controlled              Post-op Vital Signs: Reviewed and stable  Last Vitals:  Filed Vitals:   11/14/15 1030  Resp: 16    Complications: No apparent anesthesia complications

## 2015-11-14 NOTE — Anesthesia Preprocedure Evaluation (Addendum)
Anesthesia Evaluation  Patient identified by MRN, date of birth, ID band Patient awake    Reviewed: Allergy & Precautions, NPO status , Patient's Chart, lab work & pertinent test results  Airway Mallampati: II  TM Distance: >3 FB Neck ROM: Full    Dental  (+) Teeth Intact, Caps, Dental Advisory Given   Pulmonary    Pulmonary exam normal        Cardiovascular hypertension, Pt. on medications Normal cardiovascular exam     Neuro/Psych    GI/Hepatic negative GI ROS, Neg liver ROS,   Endo/Other  negative endocrine ROS  Renal/GU negative Renal ROS     Musculoskeletal negative musculoskeletal ROS (+)   Abdominal Normal abdominal exam  (+)   Peds  Hematology negative hematology ROS (+)   Anesthesia Other Findings   Reproductive/Obstetrics                            Anesthesia Physical Anesthesia Plan  ASA: II  Anesthesia Plan: MAC   Post-op Pain Management:    Induction: Intravenous  Airway Management Planned: Nasal Cannula  Additional Equipment:   Intra-op Plan:   Post-operative Plan:   Informed Consent: I have reviewed the patients History and Physical, chart, labs and discussed the procedure including the risks, benefits and alternatives for the proposed anesthesia with the patient or authorized representative who has indicated his/her understanding and acceptance.   Dental advisory given  Plan Discussed with: CRNA  Anesthesia Plan Comments:         Anesthesia Quick Evaluation

## 2015-11-14 NOTE — H&P (Signed)
I have reviewed the H&P, the patient was re-examined, and I have identified no interval changes in medical condition and plan of care since the history and physical of record  

## 2015-11-14 NOTE — Anesthesia Procedure Notes (Signed)
Procedure Name: MAC Date/Time: 11/14/2015 11:20 AM Performed by: Despina HiddenIDACAVAGE, Cesar Harris J Pre-anesthesia Checklist: Patient identified, Timeout performed, Emergency Drugs available, Suction available and Patient being monitored Oxygen Delivery Method: Nasal cannula

## 2015-11-14 NOTE — Op Note (Signed)
Date of Admission: 11/14/2015  Date of Surgery: 11/14/2015   Pre-Op Dx: Cataract Right Eye  Post-Op Dx: Senile Combined Cataract Right  Eye,  Dx Code Z61.096H25.811  Surgeon: Gemma PayorKerry Anallely Rosell, M.D.  Assistants: None  Anesthesia: Topical with MAC  Indications: Painless, progressive loss of vision with compromise of daily activities.  Surgery: Cataract Extraction with Intraocular lens Implant Right Eye  Discription: The patient had dilating drops and viscous lidocaine placed into the Right eye in the pre-op holding area. After transfer to the operating room, a time out was performed. The patient was then prepped and draped. Beginning with a 75 degree blade a paracentesis port was made at the surgeon's 2 o'clock position. The anterior chamber was then filled with 1% non-preserved lidocaine. This was followed by filling the anterior chamber with Provisc.  A 2.684mm keratome blade was used to make a clear corneal incision at the temporal limbus.  A bent cystatome needle was used to create a continuous tear capsulotomy. Hydrodissection was performed with balanced salt solution on a Fine canula. The lens nucleus was then removed using the phacoemulsification handpiece. Residual cortex was removed with the I&A handpiece. The anterior chamber and capsular bag were refilled with Provisc. A posterior chamber intraocular lens was placed into the capsular bag with it's injector. The implant was positioned with the Kuglan hook. The Provisc was then removed from the anterior chamber and capsular bag with the I&A handpiece. Stromal hydration of the main incision and paracentesis port was performed with BSS on a Fine canula. The wounds were tested for leak which was negative. The patient tolerated the procedure well. There were no operative complications. The patient was then transferred to the recovery room in stable condition.  Complications: None  Specimen: None  EBL: None  Prosthetic device: Hoya iSert 250, power 19.0  D, SN [id number].

## 2015-11-15 ENCOUNTER — Encounter (HOSPITAL_COMMUNITY): Payer: Self-pay | Admitting: Ophthalmology

## 2015-12-05 ENCOUNTER — Other Ambulatory Visit (HOSPITAL_COMMUNITY): Payer: Self-pay

## 2016-04-09 ENCOUNTER — Encounter: Payer: Self-pay | Admitting: Nurse Practitioner

## 2016-04-09 ENCOUNTER — Other Ambulatory Visit: Payer: Self-pay

## 2016-04-09 ENCOUNTER — Ambulatory Visit (INDEPENDENT_AMBULATORY_CARE_PROVIDER_SITE_OTHER): Payer: Medicare Other | Admitting: Nurse Practitioner

## 2016-04-09 VITALS — BP 159/86 | HR 71 | Temp 97.1°F | Ht 72.0 in | Wt 215.2 lb

## 2016-04-09 DIAGNOSIS — Z86010 Personal history of colonic polyps: Secondary | ICD-10-CM

## 2016-04-09 DIAGNOSIS — R1310 Dysphagia, unspecified: Secondary | ICD-10-CM

## 2016-04-09 MED ORDER — PEG-KCL-NACL-NASULF-NA ASC-C 100 G PO SOLR: 1.0000 | ORAL | Status: DC

## 2016-04-09 NOTE — Patient Instructions (Addendum)
1. Continue to your food well. Below [place] some information on TIPS to help prevent food getting stuck when he repeat. 2. We will schedule your procedures for you. 3. Return for follow-up in 2 months to check on your difficulty swallowing.    Dysphagia Swallowing problems (dysphagia) occur when solids and liquids seem to stick in your throat on the way down to your stomach, or the food takes longer to get to the stomach. Other symptoms include regurgitating food, noises coming from the throat, chest discomfort with swallowing, and a feeling of fullness or the feeling of something being stuck in your throat when swallowing. When blockage in your throat is complete, it may be associated with drooling. CAUSES  Problems with swallowing may occur because of problems with the muscles. The food cannot be propelled in the usual manner into your stomach. You may have ulcers, scar tissue, or inflammation in the tube down which food travels from your mouth to your stomach (esophagus), which blocks food from passing normally into the stomach. Causes of inflammation include:  Acid reflux from your stomach into your esophagus.  Infection.  Radiation treatment for cancer.  Medicines taken without enough fluids to wash them down into your stomach. You may have nerve problems that prevent signals from being sent to the muscles of your esophagus to contract and move your food down to your stomach. Globus pharyngeus is a relatively common problem in which there is a sense of an obstruction or difficulty in swallowing, without any physical abnormalities of the swallowing passages being found. This problem usually improves over time with reassurance and testing to rule out other causes. DIAGNOSIS Dysphagia can be diagnosed and its cause can be determined by tests in which you swallow a white substance that helps illuminate the inside of your throat (contrast medium) while X-rays are taken. Sometimes a flexible telescope  that is inserted down your throat (endoscopy) to look at your esophagus and stomach is used. TREATMENT   If the dysphagia is caused by acid reflux or infection, medicines may be used.  If the dysphagia is caused by problems with your swallowing muscles, swallowing therapy may be used to help you strengthen your swallowing muscles.  If the dysphagia is caused by a blockage or mass, procedures to remove the blockage may be done. HOME CARE INSTRUCTIONS  Try to eat soft food that is easier to swallow and check your weight on a daily basis to be sure that it is not decreasing.  Be sure to drink liquids when sitting upright (not lying down). SEEK MEDICAL CARE IF:  You are losing weight because you are unable to swallow.  You are coughing when you drink liquids (aspiration).  You are coughing up partially digested food. SEEK IMMEDIATE MEDICAL CARE IF:  You are unable to swallow your own saliva .  You are having shortness of breath or a fever, or both.  You have a hoarse voice along with difficulty swallowing. MAKE SURE YOU:  Understand these instructions.  Will watch your condition.  Will get help right away if you are not doing well or get worse.   This information is not intended to replace advice given to you by your health care provider. Make sure you discuss any questions you have with your health care provider.   Document Released: 12/14/2000 Document Revised: 01/07/2015 Document Reviewed: 06/05/2013 Elsevier Interactive Patient Education 2016 Else[employer] Inc.      Dysphagia Diet Level 3, Mechanically Advanced The dysphagia level 3 diet  includes foods that are soft, moist, and can be chopped into 1-inch chunks. This diet is helpful for people with mild swallowing difficulties. It reduces the risk of food getting caught in the windpipe, trachea, or lungs. WHAT DO I NEED TO KNOW ABOUT THIS DIET?  You may eat foods that are soft and moist.  If you were on the dysphagia  level 1 or level 2 diets, you may eat any of the foods included on those lists.  Avoid foods that are dry, hard, sticky, chewy, coarse, and crunchy. Also avoid large cuts of food.  Take small bites. Each bite should contain 1 inch or less of food.  Thicken liquids if instructed by your health care provider. Follow your health care provider's instructions on how to do this and to what consistency.  See your dietitian or speech language pathologist regularly for help with your dietary changes. WHAT FOODS CAN I EAT? Grains Moist breads without nuts or seeds. Biscuits, muffins, pancakes, and waffles well-moistened with syrup, jelly, margarine, or butter. Smooth cereals with plenty of milk to moisten them. Moist bread stuffing. Moist rice. Vegetables All cooked, soft vegetables. Shredded lettuce. Tender fried potatoes. Fruits All canned and cooked fruits. Soft, peeled fresh fruits, such as peaches, nectarines, kiwis, cantaloupe, honeydew melon, and watermelon without seeds. Soft berries, such as strawberries. Meat and Other Protein Sources Moist ground or finely diced or sliced meats. Solid, tender cuts of meat. Meatloaf. Hamburger with a bun. Sausage patty. Deli thin-sliced lunch meat. Chicken, egg, or tuna salad sandwich. Sloppy joe. Moist fish. Eggs prepared any way. Casseroles with small chunks of meats, ground meats, or tender meats. Dairy Cheese spreads without coarse large chunks. Shredded cheese. Cheese slices. Cottage cheese. Milk at the right texture. Smooth frappes. Yogurt without nuts or coconut. Ask your health care provider whether you can have frozen desserts (such as malts or milk shakes) and thin liquids. Sweets/Desserts Soft, smooth, moist desserts. Non-chewy, smooth candy. Jam. Jelly. Honey. Preserves. Ask your health care provider whether you can have frozen desserts. Fats and Oils Butter. Oils. Margarine. Mayonnaise. Gravy. Spreads. Other All seasonings and sweeteners. All  sauces without large chunks. The items listed above may not be a complete list of recommended foods or beverages. Contact your dietitian for more options. WHAT FOODS ARE NOT RECOMMENDED? Grains Coarse or dry cereals. Dry breads. Toast. Crackers. Tough, crusty breads, such French bread and baguettes. Tough, crisp fried potatoes. Potato skins. Dry bread stuffing. Granola. Popcorn. Chips. Vegetables All raw vegetables except shredded lettuce. Cooked corn. Rubbery or stiff cooked vegetables. Stringy vegetables, such as celery. Fruits Hard fruits that are difficult to chew, such as apples or pears. Stringy, high-pulp fruits, such as pineapple, papaya, or mango. Fruits with tough skins, such as grapes. Coconut. All dried fruits. Fruit leather. Fruit roll-ups. Fruit snacks. Meat and Other Protein Sources Dry or tough meats or poultry. Dry fish. Fish with bones. Peanut butter. All nuts and seeds. Dairy  Any with nuts, seeds, chocolate chips, dried fruit, coconut, or pineapple. Sweets/Desserts Dry cakes. Chewy or dry cookies. Any with nuts, seeds, dry fruits, coconut, pineapple, or anything dry, sticky, or hard. Chewy caramel. Licorice. Taffy-type candies. Ask your health care provider whether you can have frozen desserts. Fats and Oils Any with chunks, nuts, seeds, or pineapple. Olives. Rosita Fire. Other Soups with tough or large chunks of meats, poultry, or vegetables. Corn or clam chowder. The items listed above may not be a complete list of foods and beverages to avoid. Contact  your dietitian for more information.   This information is not intended to replace advice given to you by your health care provider. Make sure you discuss any questions you have with your health care provider.   Document Released: 12/17/2005 Document Revised: 01/07/2015 Document Reviewed: 11/30/2013 Elsevier Interactive Patient Education [employer] Elsevier Inc.

## 2016-04-09 NOTE — Progress Notes (Signed)
cc'ed to pcp °

## 2016-04-09 NOTE — Assessment & Plan Note (Signed)
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 Returns based on PCP recommendation for repeat colonoscopy. Generally asymptomatic from a GI standpoint other than dysphagia as noted above. We will proceed with surveillance colonoscopy.  Proceed with TCS with Dr. Jena Gaussourk in near future: the risks, benefits, and alternatives have been discussed with the patient in detail. The patient states understanding and desires to proceed.  The patient is not on any anticoagulants, anxiolytics, chronic pain medications, or antidepressants. Conscious sedation should be adequate for his procedure.

## 2016-04-09 NOTE — Assessment & Plan Note (Signed)
The patient history of esophageal narrowing status post dilation. Currently having symptoms are most with every meal. Chews food well but occasional still has dysphagia and regurgitation. Recommend continue to chew food well, dysphagia 3 diet recommendations. Return for follow-up in 3 months. At this point we'll add an endoscopy with possible dilation onto his colonoscopy.  Proceed with EGD +/- dilation with Dr. Jena Gaussourk in near future: the risks, benefits, and alternatives have been discussed with the patient in detail. The patient states understanding and desires to proceed.  The patient is not on any anticoagulants, anxiolytics, chronic pain medications, or antidepressants. Conscious sedation should be adequate for his procedure.

## 2016-04-09 NOTE — Progress Notes (Signed)
  Primary Care Physician:  FUSCO,LAWRENCE J., MD Primary Gastroenterologist:  Dr. Rourk  Chief Complaint  Patient presents with  . set up TCS    HPI:   Cesar Harris is a 65 y.o. male who presents With a history of tubulovillous adenoma to schedule a repeat colonoscopy. Last colonoscopy in our system dated 05/19/2007 for asymptomatic colorectal cancer screening. The procedure found an 8 mm polyp on a stalk at the hepatic flexure, 3 mm mid descending colon polyp, 1.25 cm polyp at the rectosigmoid juncture, 5 mm rectal polyp located 5 cm from the anal verge. These were all removed and sent to pathology. Pathology found the rectosigmoid colon polyp and hepatic flexure polyp to be tubulovillous adenoma, rectal polyps found to be hyperplastic.  Today he states he's doing pretty good overall. Denies abdominal pain, hematochezia, melena, N/V, fever, chills, unintentional weight loss, acute changes in his bowel movement. Admits history of dysphagia, has had dilation in the past but doesn't remember when, thinks it was [place]. Has dysphagia symptoms about every meal, has to chew food very well and still tends to have problems. Occasional regurgitation. Denies hematemesis. Denies odynophagia. Denies chest pain, dyspnea, lightheadedness, syncope, near syncope. Denies any other upper or lower GI symptoms.  Past Medical History  Diagnosis Date  . Hypertension   . Gout   . History of kidney stones     Past Surgical History  Procedure Laterality Date  . Hernia repair      left and right inguinal- [facility]  . Back surgery      l4 anf l5 herniated disc  . Cataract extraction w/phaco  04/03/2012    Procedure: CATARACT EXTRACTION PHACO AND INTRAOCULAR LENS PLACEMENT (IOC);  Surgeon: Kerry Hunt, MD;  Location: [facility];  Service: Ophthalmology;  Laterality: Left;  CDE: 8.94  . Cataract extraction w/phaco Right 11/14/2015    Procedure: CATARACT EXTRACTION PHACO AND INTRAOCULAR LENS PLACEMENT RIGHT  EYE;  Surgeon: Kerry Hunt, MD;  Location: [facility];  Service: Ophthalmology;  Laterality: Right;  CDE:8.19    Current Outpatient Prescriptions  Medication Sig Dispense Refill  . amLODipine-benazepril (LOTREL) 5-10 MG per capsule Take 1 capsule by mouth daily.    . cholecalciferol (VITAMIN D) 1000 UNITS tablet Take 1,000 Units by mouth daily.    . tamsulosin (FLOMAX) 0.4 MG CAPS capsule Take 0.4 mg by mouth daily.  2   No current facility-administered medications for this visit.    Allergies as of 04/09/2016  . (No Known Allergies)    Family History  Problem Relation Age of Onset  . Anesthesia problems Neg Hx   . Hypotension Neg Hx   . Malignant hyperthermia Neg Hx   . Pseudochol deficiency Neg Hx     Social History   Social History  . Marital Status: Married    Spouse Name: N/A  . Number of Children: N/A  . Years of Education: N/A   Occupational History  . Not on file.   Social History Main Topics  . Smoking status: Never Smoker   . Smokeless tobacco: Not on file  . Alcohol Use: No  . Drug Use: No  . Sexual Activity: Yes    Birth Control/ Protection: None   Other Topics Concern  . Not on file   Social History Narrative    Review of Systems: General: Negative for anorexia, weight loss, fever, chills, fatigue, weakness. ENT: Negative for hoarseness. CV: Negative for chest pain, angina, palpitations, peripheral edema.  Respiratory: Negative   for dyspnea at rest, cough, sputum, wheezing.  GI: See history of present illness. GU:  Admits nocturia due to BPH, on medication.  MS: Occasional gout flares.  Derm: Negative for rash or itching.  Endo: Negative for unusual weight change.  Heme: Negative for bruising or bleeding.     Physical Exam: BP 159/86 mmHg  Pulse 71  Temp(Src) 97.1 F (36.2 C)  Ht 6' (1.829 m)  Wt 215 lb 3.2 oz (97.614 kg)  BMI 29.18 kg/m2 General:   Alert and oriented. Pleasant and cooperative. Well-nourished and well-developed.  Head:   Normocephalic and atraumatic. Eyes:  Without icterus, sclera clear and conjunctiva pink.  Ears:  Normal auditory acuity. Cardiovascular:  S1, S2 present without murmurs appreciated. Extremities without clubbing or edema. Respiratory:  Clear to auscultation bilaterally. No wheezes, rales, or rhonchi. No distress.  Gastrointestinal:  +BS, soft, non-tender and non-distended. No HSM noted. No guarding or rebound. No masses appreciated.  Rectal:  Deferred  Neurologic:  Alert and oriented x4;  grossly normal neurologically. Psych:  Alert and cooperative. Normal mood and affect. Heme/Lymph/Immune: No excessive bruising noted.    04/09/2016 8:14 AM   Disclaimer: This note was dictated with voice recognition software. Similar sounding words can inadvertently be transcribed and may not be corrected upon review.  

## 2016-04-19 ENCOUNTER — Encounter (HOSPITAL_COMMUNITY): Payer: Self-pay

## 2016-04-19 ENCOUNTER — Encounter (HOSPITAL_COMMUNITY)
Admission: RE | Disposition: A | Discharge status: Home / Self Care | Payer: Self-pay | Source: Ambulatory Visit | Attending: Internal Medicine

## 2016-04-19 ENCOUNTER — Ambulatory Visit (HOSPITAL_COMMUNITY)
Admission: RE | Admit: 2016-04-19 | Discharge: 2016-04-19 | Disposition: A | Discharge status: Home / Self Care | Payer: Medicare Other | Source: Ambulatory Visit | Attending: Internal Medicine | Admitting: Internal Medicine

## 2016-04-19 DIAGNOSIS — I10 Essential (primary) hypertension: Secondary | ICD-10-CM | POA: Insufficient documentation

## 2016-04-19 DIAGNOSIS — K5730 Diverticulosis of large intestine without perforation or abscess without bleeding: Secondary | ICD-10-CM | POA: Insufficient documentation

## 2016-04-19 DIAGNOSIS — K449 Diaphragmatic hernia without obstruction or gangrene: Secondary | ICD-10-CM | POA: Diagnosis not present

## 2016-04-19 DIAGNOSIS — Z86010 Personal history of colonic polyps: Secondary | ICD-10-CM | POA: Diagnosis not present

## 2016-04-19 DIAGNOSIS — K229 Disease of esophagus, unspecified: Secondary | ICD-10-CM | POA: Insufficient documentation

## 2016-04-19 DIAGNOSIS — R1310 Dysphagia, unspecified: Secondary | ICD-10-CM | POA: Insufficient documentation

## 2016-04-19 DIAGNOSIS — K222 Esophageal obstruction: Secondary | ICD-10-CM

## 2016-04-19 DIAGNOSIS — Z1211 Encounter for screening for malignant neoplasm of colon: Secondary | ICD-10-CM | POA: Insufficient documentation

## 2016-04-19 DIAGNOSIS — K2289 Other specified disease of esophagus: Secondary | ICD-10-CM | POA: Insufficient documentation

## 2016-04-19 DIAGNOSIS — Z79899 Other long term (current) drug therapy: Secondary | ICD-10-CM | POA: Diagnosis not present

## 2016-04-19 HISTORY — PX: COLONOSCOPY: SHX5424

## 2016-04-19 HISTORY — PX: MALONEY DILATION: SHX5535

## 2016-04-19 HISTORY — PX: ESOPHAGOGASTRODUODENOSCOPY: SHX5428

## 2016-04-19 SURGERY — COLONOSCOPY
Anesthesia: Moderate Sedation

## 2016-04-19 MED ORDER — MIDAZOLAM HCL 5 MG/5ML IJ SOLN
INTRAMUSCULAR | Status: DC | PRN
  Administered 2016-04-19: 2 mg via INTRAVENOUS
  Administered 2016-04-19: 1 mg via INTRAVENOUS
  Administered 2016-04-19: 2 mg via INTRAVENOUS

## 2016-04-19 MED ORDER — LIDOCAINE VISCOUS 2 % MT SOLN
OROMUCOSAL | Status: DC | PRN
  Administered 2016-04-19: 1 via OROMUCOSAL

## 2016-04-19 MED ORDER — MIDAZOLAM HCL 5 MG/5ML IJ SOLN
INTRAMUSCULAR | Status: AC
  Filled 2016-04-19: qty 10

## 2016-04-19 MED ORDER — STERILE WATER FOR IRRIGATION IR SOLN
Status: DC | PRN
  Administered 2016-04-19: 2.5 mL

## 2016-04-19 MED ORDER — SODIUM CHLORIDE 0.9 % IV SOLN
INTRAVENOUS | Status: DC
  Administered 2016-04-19: 13:00:00 via INTRAVENOUS

## 2016-04-19 MED ORDER — MEPERIDINE HCL 100 MG/ML IJ SOLN
INTRAMUSCULAR | Status: AC
  Filled 2016-04-19: qty 2

## 2016-04-19 MED ORDER — ONDANSETRON HCL 4 MG/2ML IJ SOLN
INTRAMUSCULAR | Status: DC | PRN
  Administered 2016-04-19: 4 mg via INTRAVENOUS

## 2016-04-19 MED ORDER — LIDOCAINE VISCOUS 2 % MT SOLN
OROMUCOSAL | Status: AC
  Filled 2016-04-19: qty 15

## 2016-04-19 MED ORDER — ONDANSETRON HCL 4 MG/2ML IJ SOLN
INTRAMUSCULAR | Status: AC
  Filled 2016-04-19: qty 2

## 2016-04-19 MED ORDER — MEPERIDINE HCL 100 MG/ML IJ SOLN
INTRAMUSCULAR | Status: DC | PRN
  Administered 2016-04-19 (×2): 50 mg via INTRAVENOUS
  Administered 2016-04-19: 25 mg via INTRAVENOUS

## 2016-04-19 NOTE — H&P (View-Only) (Signed)
Primary Care Physician:  Cassell Smiles., MD Primary Gastroenterologist:  Dr. Jena Gauss  Chief Complaint  Patient presents with  . set up TCS    HPI:   Cesar Harris is a 65 y.o. male who presents With a history of tubulovillous adenoma to schedule a repeat colonoscopy. Last colonoscopy in our system dated 05/19/2007 for asymptomatic colorectal cancer screening. The procedure found an 8 mm polyp on a stalk at the hepatic flexure, 3 mm mid descending colon polyp, 1.25 cm polyp at the rectosigmoid juncture, 5 mm rectal polyp located 5 cm from the anal verge. These were all removed and sent to pathology. Pathology found the rectosigmoid colon polyp and hepatic flexure polyp to be tubulovillous adenoma, rectal polyps found to be hyperplastic.  Today he states he's doing pretty good overall. Denies abdominal pain, hematochezia, melena, N/V, fever, chills, unintentional weight loss, acute changes in his bowel movement. Admits history of dysphagia, has had dilation in the past but doesn't remember when, thinks it was [employer]. Has dysphagia symptoms about every meal, has to chew food very well and still tends to have problems. Occasional regurgitation. Denies hematemesis. Denies odynophagia. Denies chest pain, dyspnea, lightheadedness, syncope, near syncope. Denies any other upper or lower GI symptoms.  Past Medical History  Diagnosis Date  . Hypertension   . Gout   . History of kidney stones     Past Surgical History  Procedure Laterality Date  . Hernia repair      left and right inguinal- [facility]  . Back surgery      l4 anf l5 herniated disc  . Cataract extraction w/phaco  04/03/2012    Procedure: CATARACT EXTRACTION PHACO AND INTRAOCULAR LENS PLACEMENT (IOC);  Surgeon: Gemma Payor, MD;  Location: [facility];  Service: Ophthalmology;  Laterality: Left;  CDE: 8.94  . Cataract extraction w/phaco Right 11/14/2015    Procedure: CATARACT EXTRACTION PHACO AND INTRAOCULAR LENS PLACEMENT RIGHT  EYE;  Surgeon: Gemma Payor, MD;  Location: [facility];  Service: Ophthalmology;  Laterality: Right;  CDE:8.19    Current Outpatient Prescriptions  Medication Sig Dispense Refill  . amLODipine-benazepril (LOTREL) 5-10 MG per capsule Take 1 capsule by mouth daily.    . cholecalciferol (VITAMIN D) 1000 UNITS tablet Take 1,000 Units by mouth daily.    . tamsulosin (FLOMAX) 0.4 MG CAPS capsule Take 0.4 mg by mouth daily.  2   No current facility-administered medications for this visit.    Allergies as of 04/09/2016  . (No Known Allergies)    Family History  Problem Relation Age of Onset  . Anesthesia problems Neg Hx   . Hypotension Neg Hx   . Malignant hyperthermia Neg Hx   . Pseudochol deficiency Neg Hx     Social History   Social History  . Marital Status: Married    Spouse Name: N/A  . Number of Children: N/A  . Years of Education: N/A   Occupational History  . Not on file.   Social History Main Topics  . Smoking status: Never Smoker   . Smokeless tobacco: Not on file  . Alcohol Use: No  . Drug Use: No  . Sexual Activity: Yes    Birth Control/ Protection: None   Other Topics Concern  . Not on file   Social History Narrative    Review of Systems: General: Negative for anorexia, weight loss, fever, chills, fatigue, weakness. ENT: Negative for hoarseness. CV: Negative for chest pain, angina, palpitations, peripheral edema.  Respiratory: Negative  for dyspnea at rest, cough, sputum, wheezing.  GI: See history of present illness. GU:  Admits nocturia due to BPH, on medication.  MS: Occasional gout flares.  Derm: Negative for rash or itching.  Endo: Negative for unusual weight change.  Heme: Negative for bruising or bleeding.     Physical Exam: BP 159/86 mmHg  Pulse 71  Temp(Src) 97.1 F (36.2 C)  Ht 6' (1.829 m)  Wt 215 lb 3.2 oz (97.614 kg)  BMI 29.18 kg/m2 General:   Alert and oriented. Pleasant and cooperative. Well-nourished and well-developed.  Head:   Normocephalic and atraumatic. Eyes:  Without icterus, sclera clear and conjunctiva pink.  Ears:  Normal auditory acuity. Cardiovascular:  S1, S2 present without murmurs appreciated. Extremities without clubbing or edema. Respiratory:  Clear to auscultation bilaterally. No wheezes, rales, or rhonchi. No distress.  Gastrointestinal:  +BS, soft, non-tender and non-distended. No HSM noted. No guarding or rebound. No masses appreciated.  Rectal:  Deferred  Neurologic:  Alert and oriented x4;  grossly normal neurologically. Psych:  Alert and cooperative. Normal mood and affect. Heme/Lymph/Immune: No excessive bruising noted.    04/09/2016 8:14 AM   Disclaimer: This note was dictated with voice recognition software. Similar sounding words can inadvertently be transcribed and may not be corrected upon review.

## 2016-04-19 NOTE — Discharge Instructions (Signed)
Colonoscopy Discharge Instructions  Read the instructions outlined below and refer to this sheet in the next few weeks. These discharge instructions provide you with general information on caring for yourself after you leave the hospital. Your doctor may also give you specific instructions. While your treatment has been planned according to the most current medical practices available, unavoidable complications occasionally occur. If you have any problems or questions after discharge, call Dr. Gala Romney at [phone]. ACTIVITY  You may resume your regular activity, but move at a slower pace for the next 24 hours.   Take frequent rest periods for the next 24 hours.   Walking will help get rid of the air and reduce the bloated feeling in your belly (abdomen).   No driving for 24 hours (because of the medicine (anesthesia) used during the test).    Do not sign any important legal documents or operate any machinery for 24 hours (because of the anesthesia used during the test).  NUTRITION  Drink plenty of fluids.   You may resume your normal diet as instructed by your doctor.   Begin with a light meal and progress to your normal diet. Heavy or fried foods are harder to digest and may make you feel sick to your stomach (nauseated).   Avoid alcoholic beverages for 24 hours or as instructed.  MEDICATIONS  You may resume your normal medications unless your doctor tells you otherwise.  WHAT YOU CAN EXPECT TODAY  Some feelings of bloating in the abdomen.   Passage of more gas than usual.   Spotting of blood in your stool or on the toilet paper.  IF YOU HAD POLYPS REMOVED DURING THE COLONOSCOPY:  No aspirin products for 7 days or as instructed.   No alcohol for 7 days or as instructed.   Eat a soft diet for the next 24 hours.  FINDING OUT THE RESULTS OF YOUR TEST Not all test results are available during your visit. If your test results are not back during the visit, make an appointment  with your caregiver to find out the results. Do not assume everything is normal if you have not heard from your caregiver or the medical facility. It is important for you to follow up on all of your test results.  SEEK IMMEDIATE MEDICAL ATTENTION IF:  You have more than a spotting of blood in your stool.   Your belly is swollen (abdominal distention).   You are nauseated or vomiting.   You have a temperature over 101.  You have abdominal pain or discomfort that is severe or gets worse throughout the day. EGD Discharge instructions Please read the instructions outlined below and refer to this sheet in the next few weeks. These discharge instructions provide you with general information on caring for yourself after you leave the hospital. Your doctor may also give you specific instructions. While your treatment has been planned according to the most current medical practices available, unavoidable complications occasionally occur. If you have any problems or questions after discharge, please call your doctor. ACTIVITY You may resume your regular activity but move at a slower pace for the next 24 hours.  Take frequent rest periods for the next 24 hours.  Walking will help expel (get rid of) the air and reduce the bloated feeling in your abdomen.  No driving for 24 hours (because of the anesthesia (medicine) used during the test).  You may shower.  Do not sign any important legal documents or operate any machinery for 24  hours (because of the anesthesia used during the test).  NUTRITION Drink plenty of fluids.  You may resume your normal diet.  Begin with a light meal and progress to your normal diet.  Avoid alcoholic beverages for 24 hours or as instructed by your caregiver.  MEDICATIONS You may resume your normal medications unless your caregiver tells you otherwise.  WHAT YOU CAN EXPECT TODAY You may experience abdominal discomfort such as a feeling of fullness or gas pains.   FOLLOW-UP Your doctor will discuss the results of your test with you.  SEEK IMMEDIATE MEDICAL ATTENTION IF ANY OF THE FOLLOWING OCCUR: Excessive nausea (feeling sick to your stomach) and/or vomiting.  Severe abdominal pain and distention (swelling).  Trouble swallowing.  Temperature over 101 F (37.8 C).  Rectal bleeding or vomiting of blood.     GERD and diverticulosis information provided  Begin Protonix 40 mg daily  Repeat colonoscopy in 5 years  Further recommendations to follow pending review of pathology report  Office visit with [place] in 3 months    Gastroesophageal Reflux Disease, Adult Normally, food travels down the esophagus and stays in the stomach to be digested. However, when a person has gastroesophageal reflux disease (GERD), food and stomach acid move back up into the esophagus. When this happens, the esophagus becomes sore and inflamed. Over time, GERD can create small holes (ulcers) in the lining of the esophagus.  CAUSES This condition is caused by a problem with the muscle between the esophagus and the stomach (lower esophageal sphincter, or LES). Normally, the LES muscle closes after food passes through the esophagus to the stomach. When the LES is weakened or abnormal, it does not close properly, and that allows food and stomach acid to go back up into the esophagus. The LES can be weakened by certain dietary substances, medicines, and medical conditions, including:  Tobacco use.  Pregnancy.  Having a hiatal hernia.  Heavy alcohol use.  Certain foods and beverages, such as coffee, chocolate, onions, and peppermint. RISK FACTORS This condition is more likely to develop in:  People who have an increased body weight.  People who have connective tissue disorders.  People who use NSAID medicines. SYMPTOMS Symptoms of this condition include:  Heartburn.  Difficult or painful swallowing.  The feeling of having a lump in the throat.  Abitter taste  in the mouth.  Bad breath.  Having a large amount of saliva.  Having an upset or bloated stomach.  Belching.  Chest pain.  Shortness of breath or wheezing.  Ongoing (chronic) cough or a night-time cough.  Wearing away of tooth enamel.  Weight loss. Different conditions can cause chest pain. Make sure to see your health care provider if you experience chest pain. DIAGNOSIS Your health care provider will take a medical history and perform a physical exam. To determine if you have mild or severe GERD, your health care provider may also monitor how you respond to treatment. You may also have other tests, including:  An endoscopy toexamine your stomach and esophagus with a small camera.  A test thatmeasures the acidity level in your esophagus.  A test thatmeasures how much pressure is on your esophagus.  A barium swallow or modified barium swallow to show the shape, size, and functioning of your esophagus. TREATMENT The goal of treatment is to help relieve your symptoms and to prevent complications. Treatment for this condition may vary depending on how severe your symptoms are. Your health care provider may recommend:  Changes to  your diet.  Medicine.  Surgery. HOME CARE INSTRUCTIONS Diet  Follow a diet as recommended by your health care provider. This may involve avoiding foods and drinks such as:  Coffee and tea (with or without caffeine).  Drinks that containalcohol.  Energy drinks and sports drinks.  Carbonated drinks or sodas.  Chocolate and cocoa.  Peppermint and mint flavorings.  Garlic and onions.  Horseradish.  Spicy and acidic foods, including peppers, chili powder, curry powder, vinegar, hot sauces, and barbecue sauce.  Citrus fruit juices and citrus fruits, such as oranges, lemons, and limes.  Tomato-based foods, such as red sauce, chili, salsa, and pizza with red sauce.  Fried and fatty foods, such as donuts, french fries, potato chips,  and high-fat dressings.  High-fat meats, such as hot dogs and fatty cuts of red and white meats, such as rib eye steak, sausage, ham, and bacon.  High-fat dairy items, such as whole milk, butter, and cream cheese.  Eat small, frequent meals instead of large meals.  Avoid drinking large amounts of liquid with your meals.  Avoid eating meals during the 2-3 hours before bedtime.  Avoid lying down right after you eat.  Do not exercise right after you eat. General Instructions  Pay attention to any changes in your symptoms.  Take over-the-counter and prescription medicines only as told by your health care provider. Do not take aspirin, ibuprofen, or other NSAIDs unless your health care provider told you to do so.  Do not use any tobacco products, including cigarettes, chewing tobacco, and e-cigarettes. If you need help quitting, ask your health care provider.  Wear loose-fitting clothing. Do not wear anything tight around your waist that causes pressure on your abdomen.  Raise (elevate) the head of your bed 6 inches (15cm).  Try to reduce your stress, such as with yoga or meditation. If you need help reducing stress, ask your health care provider.  If you are overweight, reduce your weight to an amount that is healthy for you. Ask your health care provider for guidance about a safe weight loss goal.  Keep all follow-up visits as told by your health care provider. This is important. SEEK MEDICAL CARE IF:  You have new symptoms.  You have unexplained weight loss.  You have difficulty swallowing, or it hurts to swallow.  You have wheezing or a persistent cough.  Your symptoms do not improve with treatment.  You have a hoarse voice. SEEK IMMEDIATE MEDICAL CARE IF:  You have pain in your arms, neck, jaw, teeth, or back.  You feel sweaty, dizzy, or light-headed.  You have chest pain or shortness of breath.  You vomit and your vomit looks like blood or coffee  grounds.  You faint.  Your stool is bloody or black.  You cannot swallow, drink, or eat.   This information is not intended to replace advice given to you by your health care provider. Make sure you discuss any questions you have with your health care provider.   Document Released: 09/26/2005 Document Revised: 09/07/2015 Document Reviewed: 04/13/2015 Elsevier Interactive Patient Education [employer]   Diverticulosis Diverticulosis is the condition that develops when small pouches (diverticula) form in the wall of your colon. Your colon, or large intestine, is where water is absorbed and stool is formed. The pouches form when the inside layer of your colon pushes through weak spots in the outer layers of your colon. CAUSES  No one knows exactly what causes diverticulosis. RISK FACTORS  Being older than  50. Your risk for this condition increases with age. Diverticulosis is rare in people younger than 40 years. By age 66, almost everyone has it.  Eating a low-fiber diet.  Being frequently constipated.  Being overweight.  Not getting enough exercise.  Smoking.  Taking over-the-counter pain medicines, like aspirin and ibuprofen. SYMPTOMS  Most people with diverticulosis do not have symptoms. DIAGNOSIS  Because diverticulosis often has no symptoms, health care providers often discover the condition during an exam for other colon problems. In many cases, a health care provider will diagnose diverticulosis while using a flexible scope to examine the colon (colonoscopy). TREATMENT  If you have never developed an infection related to diverticulosis, you may not need treatment. If you have had an infection before, treatment may include:  Eating more fruits, vegetables, and grains.  Taking a fiber supplement.  Taking a live bacteria supplement (probiotic).  Taking medicine to relax your colon. HOME CARE INSTRUCTIONS   Drink at least 6-8 glasses of water each day to  prevent constipation.  Try not to strain when you have a bowel movement.  Keep all follow-up appointments. If you have had an infection before:  Increase the fiber in your diet as directed by your health care provider or dietitian.  Take a dietary fiber supplement if your health care provider approves.  Only take medicines as directed by your health care provider. SEEK MEDICAL CARE IF:   You have abdominal pain.  You have bloating.  You have cramps.  You have not gone to the bathroom in 3 days. SEEK IMMEDIATE MEDICAL CARE IF:   Your pain gets worse.  Yourbloating becomes very bad.  You have a fever or chills, and your symptoms suddenly get worse.  You begin vomiting.  You have bowel movements that are bloody or black. MAKE SURE YOU:  Understand these instructions.  Will watch your condition.  Will get help right away if you are not doing well or get worse.   This information is not intended to replace advice given to you by your health care provider. Make sure you discuss any questions you have with your health care provider.   Document Released: 09/13/2004 Document Revised: 12/22/2013 Document Reviewed: 11/11/2013 Elsevier Interactive Patient Education [employer] Inc[employer]

## 2016-04-19 NOTE — Interval H&P Note (Signed)
History and Physical Interval Note:  04/19/2016 12:41 PM  Cesar Harris  has presented today for surgery, with the diagnosis of dysphagia/history of polyps  The various methods of treatment have been discussed with the patient and family. After consideration of risks, benefits and other options for treatment, the patient has consented to  Procedure(s) with comments: COLONOSCOPY (N/A) - 200 - moved to 1:15 - office to notify ESOPHAGOGASTRODUODENOSCOPY (EGD) (N/A) MALONEY DILATION (N/A) as a surgical intervention .  The patient's history has been reviewed, patient examined, no change in status, stable for surgery.  I have reviewed the patient's chart and labs.  Questions were answered to the patient's satisfaction.     Robert Rourk  No change. EGD with ED and colonoscopy per plan.  The risks, benefits, limitations, imponderables and alternatives regarding both EGD and colonoscopy have been reviewed with the patient. Questions have been answered. All parties agreeable.

## 2016-04-19 NOTE — Op Note (Signed)
[facility] Patient Name: Cesar Harris Procedure Date: 04/19/2016 12:47 PM MRN: [id number] Date of Birth: Dec 15, 1951 Attending MD: Gennette Pac , MD CSN: [id number] Age: 65 Admit Type: Outpatient Procedure:                Upper GI endoscopy with [facility] dilation and biopsy Indications:              Dysphagia Providers:                Gennette Pac, MD, Nena Polio, RN, Calton Dach, Technician Referring MD:              Medicines:                Midazolam 5 mg IV, Meperidine 125 mg IV Complications:            No immediate complications. Estimated Blood Loss:     Estimated blood loss was minimal. Procedure:                Pre-Anesthesia Assessment:                           - Prior to the procedure, a History and Physical                            was performed, and patient medications and                            allergies were reviewed. The patient's tolerance of                            previous anesthesia was also reviewed. The risks                            and benefits of the procedure and the sedation                            options and risks were discussed with the patient.                            All questions were answered, and informed consent                            was obtained. Prior Anticoagulants: The patient has                            taken no previous anticoagulant or antiplatelet                            agents. ASA Grade Assessment: II - A patient with                            mild systemic disease. After reviewing the risks  and benefits, the patient was deemed in                            satisfactory condition to undergo the procedure.                           After obtaining informed consent, the endoscope was                            passed under direct vision. Throughout the                            procedure, the patient's blood pressure, pulse, and                             oxygen saturations were monitored continuously. The                            [id number] ([id number]) scope was introduced through the                            mouth, and advanced to the second part of duodenum.                            The upper GI endoscopy was accomplished without                            difficulty. The patient tolerated the procedure                            well. Scope In: 12:55:03 PM Scope Out: 1:08:28 PM Total Procedure Duration: 0 hours 13 minutes 25 seconds  Findings:      tubular esophagus pain throughout his course. No esophagitis. Question       of a 2 cm "tongue" of salmon-colored epithelium at the GE junction. No       mass or nodularity. Dilation was performed with a Maloney dilator with       mild resistance at 56 Fr. A look back revealed a superficial tear just       above the GE junction. The abnormal distal esophagus was biopsied with a       cold forceps for histology.      small hiatal hernia was present.      The stomach was normal otherwise.      The exam was otherwise without abnormality.      The second portion of the duodenum was normal. Impression:               - question short segment Barrett's esophagus.                            Patent tubular esophagus status post dilation and                            biopsy as described.                           -  Small hiatal hernia.                           - Normal stomach.                           - The examination was otherwise normal.                           - Normal second portion of the duodenum. Moderate Sedation:      Moderate (conscious) sedation was administered by the endoscopy nurse       and supervised by the endoscopist. The following parameters were       monitored: oxygen saturation, heart rate, blood pressure, respiratory       rate, EKG, adequacy of pulmonary ventilation, and response to care.       Total physician intraservice time was 38  minutes. Recommendation:           - Patient has a contact number available for                            emergencies. The signs and symptoms of potential                            delayed complications were discussed with the                            patient. Return to normal activities tomorrow.                            Written discharge instructions were provided to the                            patient.                           - Advance diet as tolerated.                           - Continue present medications.                           - Await pathology results.                           - Return to GI office in 3 months.                           - Use Protonix (pantoprazole) 40 mg PO daily. Procedure Code(s):        --- Professional ---                           [phone], Esophagogastroduodenoscopy, flexible,                            transoral; with biopsy, single or multiple  16109, Dilation of esophagus, by unguided sound or                            bougie, single or multiple passes                           99152, Moderate sedation services provided by the                            same physician or other qualified health care                            professional performing the diagnostic or                            therapeutic service that the sedation supports,                            requiring the presence of an independent trained                            observer to assist in the monitoring of the                            patient's level of consciousness and physiological                            status; initial 15 minutes of intraservice time,                            patient age 69 years or older                           [phone], Moderate sedation services; each additional                            15 minutes intraservice time                           99153, Moderate sedation services; each additional                             15 minutes intraservice time Diagnosis Code(s):        --- Professional ---                           K22.2, Esophageal obstruction                           K44.9, Diaphragmatic hernia without obstruction or                            gangrene                           R13.10, Dysphagia, unspecified CPT copyright  2016 American Medical Association. All rights reserved. The codes documented in this report are preliminary and upon coder review may  be revised to meet current compliance requirements. Cesar Friendsobert M. Luian Schumpert, MD Gennette Pacobert Michael Cassadi Purdie, MD 04/19/2016 1:38:17 PM This report has been signed electronically. Number of Addenda: 0

## 2016-04-19 NOTE — Op Note (Signed)
[facility] Patient Name: Cesar Harris Procedure Date: 04/19/2016 1:11 PM MRN: [id number] Date of Birth: 10-16-1951 Attending MD: Gennette Pac , MD CSN: [id number] Age: 65 Admit Type: Outpatient Procedure:                Colonoscopy- surveillance; history of advanced                            adenoma removed 2008 without FU Indications:              Surveillance: Personal history of adenomatous                            polyps on last colonoscopy > 5 years ago Providers:                Gennette Pac, MD, Nena Polio, RN, Calton Dach, Technician Referring MD:              Medicines:                Midazolam 5 mg IV, Meperidine 125 mg IV,                            Ondansetron 4 mg IV Complications:            No immediate complications. Estimated Blood Loss:     Estimated blood loss: none. Procedure:                Pre-Anesthesia Assessment:                           - Prior to the procedure, a History and Physical                            was performed, and patient medications and                            allergies were reviewed. The patient's tolerance of                            previous anesthesia was also reviewed. The risks                            and benefits of the procedure and the sedation                            options and risks were discussed with the patient.                            All questions were answered, and informed consent                            was obtained. Prior Anticoagulants: The patient has  taken no previous anticoagulant or antiplatelet                            agents. ASA Grade Assessment: II - A patient with                            mild systemic disease. After reviewing the risks                            and benefits, the patient was deemed in                            satisfactory condition to undergo the procedure.                           - Prior  to the procedure, a History and Physical                            was performed, and patient medications and                            allergies were reviewed. The patient's tolerance of                            previous anesthesia was also reviewed. The risks                            and benefits of the procedure and the sedation                            options and risks were discussed with the patient.                            All questions were answered, and informed consent                            was obtained. Prior Anticoagulants: The patient has                            taken no previous anticoagulant or antiplatelet                            agents. ASA Grade Assessment: II - A patient with                            mild systemic disease. After reviewing the risks                            and benefits, the patient was deemed in                            satisfactory condition to undergo the procedure.  After obtaining informed consent, the colonoscope                            was passed under direct vision. Throughout the                            procedure, the patient's blood pressure, pulse, and                            oxygen saturations were monitored continuously. The                            [id number] ([id number]) scope was introduced through                            the anus and advanced to the the cecum, identified                            by appendiceal orifice and ileocecal valve. The                            colonoscopy was performed without difficulty. The                            patient tolerated the procedure well. The quality                            of the bowel preparation was adequate. Anatomical                            landmarks were photographed. Scope In: 1:12:03 PM Scope Out: 1:25:24 PM Scope Withdrawal Time: 0 hours 8 minutes 47 seconds  Total Procedure Duration: 0 hours 13 minutes 21 seconds   Findings:      Scattered medium-mouthed diverticula were found in the sigmoid colon and       descending colon. Estimated blood loss: none.      The exam was otherwise without abnormality on direct and retroflexion       views.      The perianal and digital rectal examinations were normal. Impression:               - Diverticulosis in the sigmoid colon and in the                            descending colon.                           - The examination was otherwise normal on direct                            and retroflexion views.                           - No specimens collected. Moderate Sedation:      Moderate (conscious) sedation was administered by the endoscopy nurse  and supervised by the endoscopist. The following parameters were       monitored: oxygen saturation, heart rate, blood pressure, respiratory       rate, EKG, adequacy of pulmonary ventilation, and response to care.       Total physician intraservice time was 38 minutes. Recommendation:           - Patient has a contact number available for                            emergencies. The signs and symptoms of potential                            delayed complications were discussed with the                            patient. Return to normal activities tomorrow.                            Written discharge instructions were provided to the                            patient.                           - Resume previous diet.                           - Continue present medications.                           - Await pathology results.                           - Repeat colonoscopy in 5 years for adenoma                            surveillance.                           - Return to GI clinic in 3 months. Procedure Code(s):        --- Professional ---                           [phone], Colonoscopy, flexible; diagnostic, including                            collection of specimen(s) by brushing or washing,                             when performed (separate procedure)                           99152, Moderate sedation services provided by the                            same physician or other qualified health care  professional performing the diagnostic or                            therapeutic service that the sedation supports,                            requiring the presence of an independent trained                            observer to assist in the monitoring of the                            patient's level of consciousness and physiological                            status; initial 15 minutes of intraservice time,                            patient age 24 years or older                           [phone], Moderate sedation services; each additional                            15 minutes intraservice time                           99153, Moderate sedation services; each additional                            15 minutes intraservice time Diagnosis Code(s):        --- Professional ---                           Z86.010, Personal history of colonic polyps                           K57.30, Diverticulosis of large intestine without                            perforation or abscess without bleeding CPT copyright 2016 American Medical Association. All rights reserved. The codes documented in this report are preliminary and upon coder review may  be revised to meet current compliance requirements. Gerrit Friends. Arushi Partridge, MD Gennette Pac, MD 04/19/2016 1:43:03 PM This report has been signed electronically. Number of Addenda: 0

## 2016-04-24 ENCOUNTER — Encounter (HOSPITAL_COMMUNITY): Payer: Self-pay | Admitting: Internal Medicine

## 2016-04-25 ENCOUNTER — Encounter: Payer: Self-pay | Admitting: Internal Medicine

## 2016-04-26 ENCOUNTER — Telehealth: Payer: Self-pay

## 2016-04-26 NOTE — Telephone Encounter (Signed)
Letter mailed to the pt. 

## 2016-04-26 NOTE — Telephone Encounter (Signed)
Per RMR- Send letter to patient.  Send copy of letter with path to referring provider and PCP.   Needs an office visit in one year set up a repeat EGD over and above any other office follow-up scheduled in the near future.

## 2016-04-27 NOTE — Telephone Encounter (Signed)
APPT MADE AND ON RECALL  °

## 2016-06-11 ENCOUNTER — Encounter: Payer: Self-pay | Admitting: Nurse Practitioner

## 2016-06-11 ENCOUNTER — Ambulatory Visit (INDEPENDENT_AMBULATORY_CARE_PROVIDER_SITE_OTHER): Payer: Medicare Other | Admitting: Nurse Practitioner

## 2016-06-11 VITALS — BP 139/81 | HR 64 | Temp 98.2°F | Ht 72.0 in | Wt 217.0 lb

## 2016-06-11 DIAGNOSIS — Z86010 Personal history of colonic polyps: Secondary | ICD-10-CM | POA: Diagnosis not present

## 2016-06-11 DIAGNOSIS — K2270 Barrett's esophagus without dysplasia: Secondary | ICD-10-CM | POA: Diagnosis not present

## 2016-06-11 DIAGNOSIS — R1310 Dysphagia, unspecified: Secondary | ICD-10-CM

## 2016-06-11 NOTE — Assessment & Plan Note (Signed)
No polyps noted on this colonoscopy dated 04/19/2016. Recommend 5 year repeat colonoscopy due to history of adenomatous polyps.

## 2016-06-11 NOTE — Progress Notes (Signed)
cc'ed to pcp °

## 2016-06-11 NOTE — Patient Instructions (Signed)
1. Keep taking Protonix (pantoprazole) indefinitely 2. You should have a repeat endoscopy in 1 year 3. You should have a repeat colonoscopy in 5 years. 4. Return for follow-up in 1 year, or sooner if any changes in symptoms.

## 2016-06-11 NOTE — Assessment & Plan Note (Signed)
Symptoms resolved, continue to monitor. Return for follow-up in one year to schedule surveillance upper endoscopy or sooner if development of any symptoms.

## 2016-06-11 NOTE — Progress Notes (Signed)
Referring Provider: Redmond School, MD Primary Care Physician:  Glo Herring., MD Primary GI:  Dr. Gala Romney  Chief Complaint  Patient presents with  . Dysphagia    HPI:   Cesar Harris is a 65 y.o. male who presents for dysphagia. He was last seen in our office for 10/19/2016 to set up a colonoscopy. At that time he admitted a history of dysphagia with dilation in the past but doesn't remember when. Noted dysphagia symptoms with every meal, need to chew food very well and still tends to have problems. Occasional regurgitation, denies odynophagia. He was referred for his colonoscopy but there was an added upper endoscopy with possible dilation to address his dysphagia symptoms. Impression included question short segment Barrett's esophagus, patent tubular esophagus status post dilation and biopsy, small hiatal hernia, normal stomach, entirety of the remaining exam otherwise normal. Recommended continue current medications, Protonix 40 mg daily, follow-up in 3 months. Surgical pathology found benign inflamed squamocolumnar mucosa with intestinal metaplasia consistent with Barrett's esophagus. He will need surveillance GED in 1 year. Colonoscopy found diverticula, no polyps. Recommend 5 year surveillance.  Today he states he is doing quite well. Reviewed Barrett's esophagus and answered questions. No more dysphagia, odynophagia, denies GERD symptoms. Denies abdominal pain, N/V, hematochezia, melena. Denies chest pain, dyspnea, dizziness, lightheadedness, syncope, near syncope. Denies any other upper or lower GI symptoms.  Past Medical History  Diagnosis Date  . Hypertension   . Gout   . History of kidney stones     Past Surgical History  Procedure Laterality Date  . Hernia repair      left and right inguinal- [facility]  . Back surgery  1998    l4 anf l5 herniated disc  . Cataract extraction w/phaco  04/03/2012    Procedure: CATARACT EXTRACTION PHACO AND INTRAOCULAR LENS PLACEMENT  (IOC);  Surgeon: Tonny Branch, MD;  Location: [facility];  Service: Ophthalmology;  Laterality: Left;  CDE: 8.94  . Cataract extraction w/phaco Right 11/14/2015    Procedure: CATARACT EXTRACTION PHACO AND INTRAOCULAR LENS PLACEMENT RIGHT EYE;  Surgeon: Tonny Branch, MD;  Location: [facility];  Service: Ophthalmology;  Laterality: Right;  CDE:8.19  . Colonoscopy N/A 04/19/2016    Procedure: COLONOSCOPY;  Surgeon: Daneil Dolin, MD;  Location: [facility];  Service: Endoscopy;  Laterality: N/A;  200 - moved to 1:15 - office to notify  . Esophagogastroduodenoscopy N/A 04/19/2016    Procedure: ESOPHAGOGASTRODUODENOSCOPY (EGD);  Surgeon: Daneil Dolin, MD;  Location: [facility];  Service: Endoscopy;  Laterality: N/A;  Venia Minks dilation N/A 04/19/2016    Procedure: Venia Minks DILATION;  Surgeon: Daneil Dolin, MD;  Location: [facility];  Service: Endoscopy;  Laterality: N/A;    Current Outpatient Prescriptions  Medication Sig Dispense Refill  . amLODipine-benazepril (LOTREL) 5-10 MG per capsule Take 1 capsule by mouth daily.    . cholecalciferol (VITAMIN D) 1000 UNITS tablet Take 1,000 Units by mouth daily.    . tamsulosin (FLOMAX) 0.4 MG CAPS capsule Take 0.4 mg by mouth daily.  2  . peg 3350 powder (MOVIPREP) 100 g SOLR Take 1 kit (200 g total) by mouth as directed. (Patient not taking: Reported on 06/11/2016) 1 kit 0   No current facility-administered medications for this visit.    Allergies as of 06/11/2016  . (No Known Allergies)    Family History  Problem Relation Age of Onset  . Anesthesia problems Neg Hx   . Hypotension Neg Hx   .  Malignant hyperthermia Neg Hx   . Pseudochol deficiency Neg Hx   . Colon cancer Neg Hx     Social History   Social History  . Marital Status: Married    Spouse Name: N/A  . Number of Children: N/A  . Years of Education: N/A   Social History Main Topics  . Smoking status: Never Smoker   . Smokeless tobacco: Never Used  . Alcohol Use: No  . Drug Use:  No  . Sexual Activity: Yes    Birth Control/ Protection: None   Other Topics Concern  . None   Social History Narrative    Review of Systems: General: Negative for anorexia, weight loss, fever, chills, fatigue, weakness. ENT: Negative for hoarseness, difficulty swallowing. CV: Negative for chest pain, angina, palpitations, peripheral edema.  Respiratory: Negative for dyspnea at rest, cough, sputum, wheezing.  GI: See history of present illness. Endo: Negative for unusual weight change.  Heme: Negative for bruising or bleeding.   Physical Exam: BP 139/81 mmHg  Pulse 64  Temp(Src) 98.2 F (36.8 C) (Oral)  Ht 6' (1.829 m)  Wt 217 lb (98.431 kg)  BMI 29.42 kg/m2 General:   Alert and oriented. Pleasant and cooperative. Well-nourished and well-developed.  Head:  Normocephalic and atraumatic. Ears:  Normal auditory acuity. Cardiovascular:  S1, S2 present without murmurs appreciated. Normal pulses noted. Extremities without clubbing or edema. Respiratory:  Clear to auscultation bilaterally. No wheezes, rales, or rhonchi. No distress.  Gastrointestinal:  +BS, rounded but soft, non-tender and non-distended. No HSM noted. No guarding or rebound. No masses appreciated.  Rectal:  Deferred  Musculoskalatal:  Symmetrical without gross deformities. Neurologic:  Alert and oriented x4;  grossly normal neurologically. Psych:  Alert and cooperative. Normal mood and affect. Heme/Lymph/Immune: No excessive bruising noted.    06/11/2016 8:20 AM   Disclaimer: This note was dictated with voice recognition software. Similar sounding words can inadvertently be transcribed and may not be corrected upon review.

## 2016-06-11 NOTE — Assessment & Plan Note (Signed)
Biopsy confirmed Barrett's esophagus. He has been taking Protonix as recommended. Recommend take Protonix indefinitely. Return for follow-up in one year to schedule recommended 1 year surveillance upper endoscopy. Return sooner for any worsening symptoms.

## 2016-09-28 ENCOUNTER — Other Ambulatory Visit: Payer: Self-pay

## 2016-09-28 MED ORDER — PANTOPRAZOLE SODIUM 40 MG PO TBEC: 40.0000 mg | DELAYED_RELEASE_TABLET | Freq: Every day | ORAL | 3 refills | Status: DC

## 2017-02-18 ENCOUNTER — Ambulatory Visit (INDEPENDENT_AMBULATORY_CARE_PROVIDER_SITE_OTHER): Payer: Medicare Other | Admitting: Otolaryngology

## 2017-02-18 DIAGNOSIS — R42 Dizziness and giddiness: Secondary | ICD-10-CM

## 2017-02-18 DIAGNOSIS — H903 Sensorineural hearing loss, bilateral: Secondary | ICD-10-CM

## 2017-02-18 DIAGNOSIS — H9313 Tinnitus, bilateral: Secondary | ICD-10-CM

## 2017-03-05 ENCOUNTER — Encounter: Payer: Self-pay | Admitting: Internal Medicine

## 2017-10-02 ENCOUNTER — Other Ambulatory Visit: Payer: Self-pay | Admitting: Gastroenterology

## 2018-03-05 ENCOUNTER — Ambulatory Visit: Payer: Medicare Other | Admitting: Urology

## 2018-03-05 DIAGNOSIS — N401 Enlarged prostate with lower urinary tract symptoms: Secondary | ICD-10-CM

## 2018-03-05 DIAGNOSIS — R972 Elevated prostate specific antigen [PSA]: Secondary | ICD-10-CM | POA: Diagnosis not present

## 2018-09-03 ENCOUNTER — Ambulatory Visit: Payer: Medicare Other | Admitting: Urology

## 2018-09-03 DIAGNOSIS — N401 Enlarged prostate with lower urinary tract symptoms: Secondary | ICD-10-CM | POA: Diagnosis not present

## 2018-09-03 DIAGNOSIS — R972 Elevated prostate specific antigen [PSA]: Secondary | ICD-10-CM | POA: Diagnosis not present

## 2018-09-27 ENCOUNTER — Other Ambulatory Visit: Payer: Self-pay | Admitting: Nurse Practitioner

## 2019-10-28 ENCOUNTER — Ambulatory Visit (INDEPENDENT_AMBULATORY_CARE_PROVIDER_SITE_OTHER): Payer: Medicare Other | Admitting: Urology

## 2019-10-28 DIAGNOSIS — R972 Elevated prostate specific antigen [PSA]: Secondary | ICD-10-CM

## 2019-10-28 DIAGNOSIS — N401 Enlarged prostate with lower urinary tract symptoms: Secondary | ICD-10-CM | POA: Diagnosis not present

## 2020-03-14 ENCOUNTER — Ambulatory Visit: Payer: Medicare Other | Attending: Internal Medicine

## 2020-03-14 ENCOUNTER — Other Ambulatory Visit: Payer: Self-pay

## 2020-03-14 DIAGNOSIS — Z20822 Contact with and (suspected) exposure to covid-19: Secondary | ICD-10-CM

## 2020-03-15 LAB — NOVEL CORONAVIRUS, NAA: SARS-CoV-2, NAA: NOT DETECTED

## 2020-07-10 ENCOUNTER — Other Ambulatory Visit: Payer: Self-pay

## 2020-07-10 ENCOUNTER — Encounter: Payer: Self-pay | Admitting: Emergency Medicine

## 2020-07-10 ENCOUNTER — Ambulatory Visit
Admission: EM | Admit: 2020-07-10 | Discharge: 2020-07-10 | Disposition: A | Discharge status: Home / Self Care | Payer: Medicare Other

## 2020-07-10 DIAGNOSIS — M109 Gout, unspecified: Secondary | ICD-10-CM

## 2020-07-10 MED ORDER — DEXAMETHASONE SODIUM PHOSPHATE 10 MG/ML IJ SOLN
10.0000 mg | Freq: Once | INTRAMUSCULAR | Status: AC
  Administered 2020-07-10: 10 mg via INTRAMUSCULAR

## 2020-07-10 NOTE — ED Triage Notes (Signed)
Gout in right knee that started yesterday.

## 2020-07-10 NOTE — Discharge Instructions (Addendum)
Continue with colchicine Follow up with PCP for further evaluation and management Return or go to the ED if you have any new or worsening symptoms fever, chills, nausea, vomiting, redness, swelling, worsening symptoms despite treatment, etc..Marland Kitchen

## 2020-07-10 NOTE — ED Provider Notes (Signed)
[facility]   [id number] 07/10/20 Arrival Time: 1210  CC: RT knee pain  SUBJECTIVE: History from: patient. Cesar Harris is a 69 y.o. male complains of RT knee pain x 1 day.  Denies a precipitating event or specific injury.  Has hx of gout.  Localizes the pain to the RT knee.  Describes the pain as constant and throbbing in character.  Has tried colchicine with relief.  However, would like a steroid shot.  Symptoms are made worse to the touch.  Reports similar symptoms in the past with gout.  Denies fever, chills, erythema, ecchymosis, weakness, numbness and tingling.  ROS: As per HPI.  All other pertinent ROS negative.     Past Medical History:  Diagnosis Date  . Gout   . History of kidney stones   . Hypertension    Past Surgical History:  Procedure Laterality Date  . BACK SURGERY  1998   l4 anf l5 herniated disc  . CATARACT EXTRACTION W/PHACO  04/03/2012   Procedure: CATARACT EXTRACTION PHACO AND INTRAOCULAR LENS PLACEMENT (IOC);  Surgeon: Gemma Payor, MD;  Location: [facility];  Service: Ophthalmology;  Laterality: Left;  CDE: 8.94  . CATARACT EXTRACTION W/PHACO Right 11/14/2015   Procedure: CATARACT EXTRACTION PHACO AND INTRAOCULAR LENS PLACEMENT RIGHT EYE;  Surgeon: Gemma Payor, MD;  Location: [facility];  Service: Ophthalmology;  Laterality: Right;  CDE:8.19  . COLONOSCOPY N/A 04/19/2016   Procedure: COLONOSCOPY;  Surgeon: Corbin Ade, MD;  Location: [facility];  Service: Endoscopy;  Laterality: N/A;  200 - moved to 1:15 - office to notify  . ESOPHAGOGASTRODUODENOSCOPY N/A 04/19/2016   Procedure: ESOPHAGOGASTRODUODENOSCOPY (EGD);  Surgeon: Corbin Ade, MD;  Location: [facility];  Service: Endoscopy;  Laterality: N/A;  . HERNIA REPAIR     left and right inguinal- [place] [facility]  . MALONEY DILATION N/A 04/19/2016   Procedure: Elease Hashimoto DILATION;  Surgeon: Corbin Ade, MD;  Location: [facility];  Service: Endoscopy;  Laterality: N/A;   No Known Allergies No current  facility-administered medications on file prior to encounter.   Current Outpatient Medications on File Prior to Encounter  Medication Sig Dispense Refill  . colchicine (COLCRYS) 0.6 MG tablet TAKE 1 TABLET EVERY 3 TO 4 HOURS UNTIL RELIEF    . amLODipine-benazepril (LOTREL) 5-10 MG per capsule Take 1 capsule by mouth daily.    . cholecalciferol (VITAMIN D) 1000 UNITS tablet Take 1,000 Units by mouth daily.    . pantoprazole (PROTONIX) 40 MG tablet TAKE 1 TABLET BY MOUTH EVERY DAY 90 tablet 3  . tamsulosin (FLOMAX) 0.4 MG CAPS capsule Take 0.4 mg by mouth daily.  2   Social History   Socioeconomic History  . Marital status: Married    Spouse name: Not on file  . Number of children: Not on file  . Years of education: Not on file  . Highest education level: Not on file  Occupational History  . Not on file  Tobacco Use  . Smoking status: Never Smoker  . Smokeless tobacco: Never Used  Substance and Sexual Activity  . Alcohol use: No    Alcohol/week: 0.0 standard drinks  . Drug use: No  . Sexual activity: Yes    Birth control/protection: None  Other Topics Concern  . Not on file  Social History Narrative  . Not on file   Social Determinants of Health   Financial Resource Strain:   . Difficulty of Paying Living Expenses:   Food Insecurity:   .  Worried About Programme researcher, broadcasting/film/video in the Last Year:   . Barista in the Last Year:   Transportation Needs:   . Freight forwarder (Medical):   Marland Kitchen Lack of Transportation (Non-Medical):   Physical Activity:   . Days of Exercise per Week:   . Minutes of Exercise per Session:   Stress:   . Feeling of Stress :   Social Connections:   . Frequency of Communication with Friends and Family:   . Frequency of Social Gatherings with Friends and Family:   . Attends Religious Services:   . Active Member of Clubs or Organizations:   . Attends Banker Meetings:   Marland Kitchen Marital Status:   Intimate Partner Violence:   . Fear of  Current or Ex-Partner:   . Emotionally Abused:   Marland Kitchen Physically Abused:   . Sexually Abused:    Family History  Problem Relation Age of Onset  . Anesthesia problems Neg Hx   . Hypotension Neg Hx   . Malignant hyperthermia Neg Hx   . Pseudochol deficiency Neg Hx   . Colon cancer Neg Hx     OBJECTIVE:  Vitals:   07/10/20 1218 07/10/20 1219  BP: (!) 156/83   Pulse: 69   Resp: 16   Temp: 97.8 F (36.6 C)   TempSrc: Oral   Weight:  219 lb (99.3 kg)    General appearance: ALERT; in no acute distress.  Head: NCAT Lungs: Normal respiratory effort Musculoskeletal: RT knee Inspection: Skin warm, dry, clear and intact without obvious erythema, effusion, or ecchymosis. Warm to the touch Palpation: TTP over anteromedial knee ROM: FROM active and passive Strength: 5/5 knee flexion, 5/5 knee extension Skin: warm and dry Neurologic: Ambulates without difficulty; Sensation intact about the lower extremities Psychological: alert and cooperative; normal mood and affect   ASSESSMENT & PLAN:  1. Acute gout of right knee, unspecified cause     Meds ordered this encounter  Medications  . dexamethasone (DECADRON) injection 10 mg   Continue with colchicine Follow up with PCP for further evaluation and management Return or go to the ED if you have any new or worsening symptoms fever, chills, nausea, vomiting, redness, swelling, worsening symptoms despite treatment, etc...    Reviewed expectations re: course of current medical issues. Questions answered. Outlined signs and symptoms indicating need for more acute intervention. Patient verbalized understanding. After Visit Summary given.    Rennis Harding, PA-C 07/10/20 1230

## 2020-08-21 ENCOUNTER — Ambulatory Visit
Admission: EM | Admit: 2020-08-21 | Discharge: 2020-08-21 | Disposition: A | Discharge status: Home / Self Care | Payer: Medicare Other | Attending: Family Medicine | Admitting: Family Medicine

## 2020-08-21 DIAGNOSIS — M79671 Pain in right foot: Secondary | ICD-10-CM | POA: Diagnosis not present

## 2020-08-21 DIAGNOSIS — M109 Gout, unspecified: Secondary | ICD-10-CM | POA: Diagnosis not present

## 2020-08-21 MED ORDER — DEXAMETHASONE SODIUM PHOSPHATE 10 MG/ML IJ SOLN
10.0000 mg | Freq: Once | INTRAMUSCULAR | Status: AC
  Administered 2020-08-21: 10 mg via INTRAMUSCULAR

## 2020-08-21 NOTE — ED Triage Notes (Signed)
Pt presents with pain in right foot, has h/o gout. Taking allipurinol without much relief

## 2020-08-21 NOTE — ED Provider Notes (Signed)
[facility]   [id number] 08/21/20 Arrival Time: 1243  EN:IDPOE PAIN  SUBJECTIVE: History from: patient. Cesar Harris is a 69 y.o. male complains of right foot and ankle pain that began 3 days ago. Has hx gout. Reports that he takes allopurinol for prevention of flare up. Reports that he has colchicine at home and has been taking this with little relief. Denies a precipitating event or specific injury. Reports that he is aware that he needs to follow a low purine diet, and is attributes his gout flare to his diet. Describes the pain as constant and achy in character. Has tried OTC medications without relief. Symptoms are made worse with activity. Denies fever, chills, erythema, ecchymosis, effusion, weakness, numbness and tingling, saddle paresthesias, loss of bowel or bladder function.      ROS: As per HPI.  All other pertinent ROS negative.     Past Medical History:  Diagnosis Date  . Gout   . History of kidney stones   . Hypertension    Past Surgical History:  Procedure Laterality Date  . BACK SURGERY  1998   l4 anf l5 herniated disc  . CATARACT EXTRACTION W/PHACO  04/03/2012   Procedure: CATARACT EXTRACTION PHACO AND INTRAOCULAR LENS PLACEMENT (IOC);  Surgeon: Gemma Payor, MD;  Location: [facility];  Service: Ophthalmology;  Laterality: Left;  CDE: 8.94  . CATARACT EXTRACTION W/PHACO Right 11/14/2015   Procedure: CATARACT EXTRACTION PHACO AND INTRAOCULAR LENS PLACEMENT RIGHT EYE;  Surgeon: Gemma Payor, MD;  Location: [facility];  Service: Ophthalmology;  Laterality: Right;  CDE:8.19  . COLONOSCOPY N/A 04/19/2016   Procedure: COLONOSCOPY;  Surgeon: Corbin Ade, MD;  [facility];  Service: Endoscopy;  Laterality: N/A;  200 - moved to 1:15 - office to notify  . ESOPHAGOGASTRODUODENOSCOPY N/A 04/19/2016   Procedure: ESOPHAGOGASTRODUODENOSCOPY (EGD);  Surgeon: Corbin Ade, MD;  [facility];  Service: Endoscopy;  Laterality: N/A;  . HERNIA REPAIR     left and  right inguinal- [place] [facility]  . MALONEY DILATION N/A 04/19/2016   Procedure: Elease Hashimoto DILATION;  Surgeon: Corbin Ade, MD;  [facility];  Service: Endoscopy;  Laterality: N/A;   No Known Allergies No current facility-administered medications on file prior to encounter.   Current Outpatient Medications on File Prior to Encounter  Medication Sig Dispense Refill  . amLODipine-benazepril (LOTREL) 5-10 MG per capsule Take 1 capsule by mouth daily.    . cholecalciferol (VITAMIN D) 1000 UNITS tablet Take 1,000 Units by mouth daily.    . colchicine (COLCRYS) 0.6 MG tablet TAKE 1 TABLET EVERY 3 TO 4 HOURS UNTIL RELIEF    . pantoprazole (PROTONIX) 40 MG tablet TAKE 1 TABLET BY MOUTH EVERY DAY 90 tablet 3  . tamsulosin (FLOMAX) 0.4 MG CAPS capsule Take 0.4 mg by mouth daily.  2   Social History   Socioeconomic History  . Marital status: Married    Spouse name: Not on file  . Number of children: Not on file  . Years of education: Not on file  . Highest education level: Not on file  Occupational History  . Not on file  Tobacco Use  . Smoking status: Never Smoker  . Smokeless tobacco: Never Used  Substance and Sexual Activity  . Alcohol use: No    Alcohol/week: 0.0 standard drinks  . Drug use: No  . Sexual activity: Yes    Birth control/protection: None  Other Topics Concern  . Not on file  Social  History Narrative  . Not on file   Social Determinants of Health   Financial Resource Strain:   . Difficulty of Paying Living Expenses: Not on file  Food Insecurity:   . Worried About Programme researcher, broadcasting/film/video in the Last Year: Not on file  . Ran Out of Food in the Last Year: Not on file  Transportation Needs:   . Lack of Transportation (Medical): Not on file  . Lack of Transportation (Non-Medical): Not on file  Physical Activity:   . Days of Exercise per Week: Not on file  . Minutes of Exercise per Session: Not on file  Stress:   . Feeling of Stress : Not on file  Social  Connections:   . Frequency of Communication with Friends and Family: Not on file  . Frequency of Social Gatherings with Friends and Family: Not on file  . Attends Religious Services: Not on file  . Active Member of Clubs or Organizations: Not on file  . Attends Banker Meetings: Not on file  . Marital Status: Not on file  Intimate Partner Violence:   . Fear of Current or Ex-Partner: Not on file  . Emotionally Abused: Not on file  . Physically Abused: Not on file  . Sexually Abused: Not on file   Family History  Problem Relation Age of Onset  . Anesthesia problems Neg Hx   . Hypotension Neg Hx   . Malignant hyperthermia Neg Hx   . Pseudochol deficiency Neg Hx   . Colon cancer Neg Hx     OBJECTIVE:  Vitals:   08/21/20 1332  BP: (!) 155/82  Pulse: 64  Resp: 18  Temp: 98 F (36.7 C)  SpO2: 94%    General appearance: ALERT; in no acute distress.  Head: NCAT Lungs: Normal respiratory effort CV:  pulses 2+ bilaterally. Cap refill < 2 seconds Musculoskeletal:  Inspection: Skin warm, dry, clear and intact without obvious  ecchymosis. Erythema and swelling to medial aspect of R foot and ankle Palpation: R foot and ankle tender to palpation ROM: FROM active and passive Skin: warm and dry Neurologic: Ambulates without difficulty; Sensation intact about the upper/ lower extremities Psychological: alert and cooperative; normal mood and affect  DIAGNOSTIC STUDIES:  No results found.   ASSESSMENT & PLAN:  1. Acute gout of right foot, unspecified cause   2. Right foot pain       Meds ordered this encounter  Medications  . dexamethasone (DECADRON) injection 10 mg   Decadron 10mg  IM in office today Discussed low purine diet Continue conservative management of rest, ice, and gentle stretches Take ibuprofen as needed for pain relief (may cause abdominal discomfort, ulcers, and GI bleeds avoid taking with other NSAIDs) Return or go to the ER if you have any new  or worsening symptoms (fever, chills, chest pain, abdominal pain, changes in bowel or bladder habits, pain radiating into lower legs)    Reviewed expectations re: course of current medical issues. Questions answered. Outlined signs and symptoms indicating need for more acute intervention. Patient verbalized understanding. After Visit Summary given.       , NP 08/24/20 [phone]

## 2020-08-21 NOTE — Discharge Instructions (Addendum)
You have received a steroid injection in the office today  Continue home medication regimen  Follow up as needed

## 2020-10-15 ENCOUNTER — Other Ambulatory Visit: Payer: Self-pay | Admitting: Urology

## 2020-11-07 ENCOUNTER — Ambulatory Visit (INDEPENDENT_AMBULATORY_CARE_PROVIDER_SITE_OTHER): Payer: Medicare Other | Admitting: Urology

## 2020-11-07 ENCOUNTER — Other Ambulatory Visit: Payer: Self-pay

## 2020-11-07 ENCOUNTER — Encounter: Payer: Self-pay | Admitting: Urology

## 2020-11-07 VITALS — BP 162/80 | HR 66 | Temp 98.0°F | Ht 72.0 in | Wt 220.0 lb

## 2020-11-07 DIAGNOSIS — N401 Enlarged prostate with lower urinary tract symptoms: Secondary | ICD-10-CM

## 2020-11-07 DIAGNOSIS — R351 Nocturia: Secondary | ICD-10-CM

## 2020-11-07 DIAGNOSIS — R972 Elevated prostate specific antigen [PSA]: Secondary | ICD-10-CM | POA: Diagnosis not present

## 2020-11-07 LAB — URINALYSIS, ROUTINE W REFLEX MICROSCOPIC
Bilirubin, UA: NEGATIVE
Glucose, UA: NEGATIVE
Ketones, UA: NEGATIVE
Leukocytes,UA: NEGATIVE
Nitrite, UA: NEGATIVE
Specific Gravity, UA: 1.025 (ref 1.005–1.030)
Urobilinogen, Ur: 0.2 mg/dL (ref 0.2–1.0)
pH, UA: 5.5 (ref 5.0–7.5)

## 2020-11-07 LAB — MICROSCOPIC EXAMINATION
Bacteria, UA: NONE SEEN
Epithelial Cells (non renal): NONE SEEN /hpf (ref 0–10)
Renal Epithel, UA: NONE SEEN /hpf
WBC, UA: NONE SEEN /hpf (ref 0–5)

## 2020-11-07 MED ORDER — MIRABEGRON ER 25 MG PO TB24: 25.0000 mg | ORAL_TABLET | Freq: Every day | ORAL | 0 refills | Status: DC

## 2020-11-07 MED ORDER — SILODOSIN 8 MG PO CAPS
8.0000 mg | ORAL_CAPSULE | Freq: Every day | ORAL | 3 refills | Status: DC
Start: 2020-11-07 — End: 2021-11-08

## 2020-11-07 NOTE — Progress Notes (Signed)
11/07/2020 9:18 AM   Cesar Harris 1951/09/03 [id number]  Referring provider: Elfredia Nevins, MD [address] [place],  [place] [address]  Followup BPH and elevated PSA  HPI: Cesar Harris is a 69yo here for followup for elevated PSA and BPH.  He is currently on rapaflo 8mg  daily. He continues to have nocturia 2-3x. Stream strong. NO urinary frequency or daytime urgency.  NO recent PSA.    His records from [facility] are as follows: My PSA is elevated above the normal range.  HPI: Cesar Harris is a 69 year-old male established patient who is here for an elevated PSA.  His PSA is 4.7. He has had PSA's drawn prior to this one. He has had elevated PSA's prior to this one. When the elevated PSA was drawn, he reports having urinary frequency. He has not had a prostate nodule on a physical examination. He has not had recurrent prostate infections or chronic prostatitis.   He has not been on antibiotics for prostate infections previously. He has had a prostate biopsy done. His first prostate biopsy was done 03/06/2015. He does not have the pathology report from his biopsy.   03/05/2018: He was previously seen by Dr. 05/05/2018 for elevated PSA and had a negative biopsy in 2016. No PSA since Nov 2017   09/03/2018: PSA decreased to 2.2. NO new LUTS   10/28/2019: PSA increased to 4.5. no worsening LUTS. NOcturia 2x.     CC: I have an enlarged prostate (follow-up).  HPI: He is currently taking tamsulosin and uroxatral. He is not on new medications for symptoms of prostate enlargement.   He does not have an abnormal sensation when needing to urinate. He is not having problems getting his urine stream started. He does not have a good size and strength to his urinary stream. He is not having problems with emptying his bladder well. He does not dribble at the end of urination.   03/05/2018: He has been on flomax 0.4mg  for over 3 years.   09/03/2018: His nocturia decreased to 0-1x on uroxatral.   10/28/2019:  nocturia 2x.       PMH: Past Medical History:  Diagnosis Date   Gout    History of kidney stones    Hypertension     Surgical History: Past Surgical History:  Procedure Laterality Date   BACK SURGERY  1998   l4 anf l5 herniated disc   CATARACT EXTRACTION W/PHACO  04/03/2012   Procedure: CATARACT EXTRACTION PHACO AND INTRAOCULAR LENS PLACEMENT (IOC);  Surgeon: 06/03/2012, MD;  Location: [facility];  Service: Ophthalmology;  Laterality: Left;  CDE: 8.94   CATARACT EXTRACTION W/PHACO Right 11/14/2015   Procedure: CATARACT EXTRACTION PHACO AND INTRAOCULAR LENS PLACEMENT RIGHT EYE;  Surgeon: 11/16/2015, MD;  Location: [facility];  Service: Ophthalmology;  Laterality: Right;  CDE:8.19   COLONOSCOPY N/A 04/19/2016   Procedure: COLONOSCOPY;  Surgeon: 04/21/2016, MD;  [facility];  Service: Endoscopy;  Laterality: N/A;  200 - moved to 1:15 - office to notify   ESOPHAGOGASTRODUODENOSCOPY N/A 04/19/2016   Procedure: ESOPHAGOGASTRODUODENOSCOPY (EGD);  Surgeon: 04/21/2016, MD;  [facility];  Service: Endoscopy;  Laterality: N/A;   HERNIA REPAIR     left and right inguinal- Corbin Ade   MALONEY DILATION N/A 04/19/2016   Procedure: 04/21/2016 DILATION;  Surgeon: Elease Hashimoto, MD;  [facility];  Service: Endoscopy;  Laterality: N/A;    Home Medications:  Allergies as of 11/07/2020  No Known Allergies     Medication List       Accurate as of November 07, 2020  9:18 AM. If you have any questions, ask your nurse or doctor.        amLODipine-benazepril 5-10 MG capsule Commonly known as: LOTREL Take 1 capsule by mouth daily.   cholecalciferol 25 MCG (1000 UNIT) tablet Commonly known as: VITAMIN D Take 1,000 Units by mouth daily.   Colcrys 0.6 MG tablet Generic drug: colchicine TAKE 1 TABLET EVERY 3 TO 4 HOURS UNTIL RELIEF   pantoprazole 40 MG tablet Commonly known as: PROTONIX TAKE 1 TABLET BY MOUTH EVERY DAY   saw palmetto 500 MG  capsule Take 900 mg by mouth daily.   silodosin 8 MG Caps capsule Commonly known as: RAPAFLO TAKE 1 CAPSULE BY MOUTH EVERYDAY AT BEDTIME   tamsulosin 0.4 MG Caps capsule Commonly known as: FLOMAX Take 0.4 mg by mouth daily.       Allergies: No Known Allergies  Family History: Family History  Problem Relation Age of Onset   Anesthesia problems Neg Hx    Hypotension Neg Hx    Malignant hyperthermia Neg Hx    Pseudochol deficiency Neg Hx    Colon cancer Neg Hx     Social History:  reports that he has never smoked. He has never used smokeless tobacco. He reports that he does not drink alcohol and does not use drugs.  ROS: All other review of systems were reviewed and are negative except what is noted above in HPI  Physical Exam: BP (!) 162/80    Pulse 66    Temp 98 F (36.7 C)    Ht 6' (1.829 m)    Wt 220 lb (99.8 kg)    BMI 29.84 kg/m   Constitutional:  Alert and oriented, No acute distress. HEENT: [place] AT, moist mucus membranes.  Trachea midline, no masses. Cardiovascular: No clubbing, cyanosis, or edema. Respiratory: Normal respiratory effort, no increased work of breathing. GI: Abdomen is soft, nontender, nondistended, no abdominal masses GU: No CVA tenderness.  Lymph: No cervical or inguinal lymphadenopathy. Skin: No rashes, bruises or suspicious lesions. Neurologic: Grossly intact, no focal deficits, moving all 4 extremities. Psychiatric: Normal mood and affect.  Laboratory Data: Lab Results  Component Value Date   WBC 7.5 11/08/2015   HGB 15.7 11/08/2015   HCT 46.3 11/08/2015   MCV 85.3 11/08/2015   PLT 180 11/08/2015    Lab Results  Component Value Date   CREATININE 1.32 (H) 11/08/2015    No results found for: PSA  No results found for: TESTOSTERONE  No results found for: HGBA1C  Urinalysis No results found for: COLORURINE, APPEARANCEUR, LABSPEC, PHURINE, GLUCOSEU, HGBUR, BILIRUBINUR, KETONESUR, PROTEINUR, UROBILINOGEN, NITRITE,  LEUKOCYTESUR  No results found for: LABMICR, WBCUA, RBCUA, LABEPIT, MUCUS, BACTERIA  Pertinent Imaging:  No results found for this or any previous visit.  No results found for this or any previous visit.  No results found for this or any previous visit.  No results found for this or any previous visit.  No results found for this or any previous visit.  No results found for this or any previous visit.  No results found for this or any previous visit.  No results found for this or any previous visit.   Assessment & Plan:    1. Elevated PSA -PSA today, if stable RTC 1 year with PSA - Urinalysis, Routine w reflex microscopic  2. Benign localized prostatic hyperplasia with lower urinary  tract symptoms (LUTS) -Continue rapaflo 8mg   3. Nocturia -We will mirabegron 25mg     No follow-ups on file.  , MD  [facility] [place]

## 2020-11-07 NOTE — Progress Notes (Signed)
Urological Symptom Review  Patient is experiencing the following symptoms: Frequent urination Get up at night to urinate Erection problems (male only)  Kidney stones   Review of Systems  Gastrointestinal (upper)  : Negative for upper GI symptoms  Gastrointestinal (lower) : Negative for lower GI symptoms  Constitutional : Negative for symptoms  Skin: Negative for skin symptoms  Eyes: Negative for eye symptoms  Ear/Nose/Throat : Sore throat Sinus problems  Hematologic/Lymphatic: Negative for Hematologic/Lymphatic symptoms  Cardiovascular : Negative for cardiovascular symptoms  Respiratory : Cough  Endocrine: Negative for endocrine symptoms  Musculoskeletal: Negative for musculoskeletal symptoms  Neurological: Negative for neurological symptoms  Psychologic: Negative for psychiatric symptoms

## 2020-11-07 NOTE — Patient Instructions (Signed)

## 2020-11-08 LAB — PSA: Prostate Specific Ag, Serum: 8.4 ng/mL — ABNORMAL HIGH (ref 0.0–4.0)

## 2020-11-16 ENCOUNTER — Telehealth: Payer: Self-pay

## 2020-11-16 NOTE — Telephone Encounter (Signed)
Left message to return call 

## 2020-11-16 NOTE — Telephone Encounter (Signed)
-----   Message from Malen Gauze, MD sent at 11/15/2020  8:19 AM EST ----- PSa increased significantly to 8.4. Please recheck PSa. If it is still around 8 he will need a prostate biopsy ----- Message ----- From: Ferdinand Lango, RN Sent: 11/08/2020  10:24 AM EST To: Malen Gauze, MD  Please review

## 2020-11-17 ENCOUNTER — Other Ambulatory Visit: Payer: Self-pay

## 2020-11-17 DIAGNOSIS — R972 Elevated prostate specific antigen [PSA]: Secondary | ICD-10-CM

## 2020-11-17 NOTE — Telephone Encounter (Signed)
Pt returned call and made aware. Lab appt made.

## 2020-12-01 ENCOUNTER — Telehealth: Payer: Self-pay | Admitting: Urology

## 2020-12-01 ENCOUNTER — Other Ambulatory Visit: Payer: Self-pay

## 2020-12-01 ENCOUNTER — Other Ambulatory Visit: Payer: Self-pay | Admitting: Urology

## 2020-12-01 ENCOUNTER — Other Ambulatory Visit: Payer: Medicare Other

## 2020-12-01 DIAGNOSIS — R351 Nocturia: Secondary | ICD-10-CM

## 2020-12-01 MED ORDER — MIRABEGRON ER 25 MG PO TB24: 25.0000 mg | ORAL_TABLET | Freq: Every day | ORAL | 11 refills | Status: DC

## 2020-12-01 NOTE — Telephone Encounter (Signed)
Patient requested a prescription for myrbetriq be sent in. He states samples are helping.

## 2020-12-01 NOTE — Telephone Encounter (Signed)
Medication sent to pharmacy  

## 2020-12-02 LAB — PSA: Prostate Specific Ag, Serum: 6.3 ng/mL — ABNORMAL HIGH (ref 0.0–4.0)

## 2020-12-12 ENCOUNTER — Encounter: Payer: Self-pay | Admitting: Urology

## 2021-01-05 ENCOUNTER — Other Ambulatory Visit: Payer: Self-pay

## 2021-01-05 ENCOUNTER — Ambulatory Visit (INDEPENDENT_AMBULATORY_CARE_PROVIDER_SITE_OTHER): Payer: Medicare Other

## 2021-01-05 ENCOUNTER — Encounter: Payer: Self-pay | Admitting: Emergency Medicine

## 2021-01-05 ENCOUNTER — Ambulatory Visit
Admission: EM | Admit: 2021-01-05 | Discharge: 2021-01-05 | Disposition: A | Discharge status: Home / Self Care | Payer: Medicare Other | Attending: Emergency Medicine | Admitting: Emergency Medicine

## 2021-01-05 DIAGNOSIS — R0602 Shortness of breath: Secondary | ICD-10-CM

## 2021-01-05 DIAGNOSIS — J069 Acute upper respiratory infection, unspecified: Secondary | ICD-10-CM

## 2021-01-05 HISTORY — DX: COVID-19: U07.1

## 2021-01-05 MED ORDER — PREDNISONE 10 MG PO TABS: 20.0000 mg | ORAL_TABLET | Freq: Every day | ORAL | 0 refills | Status: AC

## 2021-01-05 MED ORDER — BENZONATATE 100 MG PO CAPS: 100.0000 mg | ORAL_CAPSULE | Freq: Three times a day (TID) | ORAL | 0 refills | Status: DC | PRN

## 2021-01-05 MED ORDER — ALBUTEROL SULFATE HFA 108 (90 BASE) MCG/ACT IN AERS: 1.0000 | INHALATION_SPRAY | Freq: Four times a day (QID) | RESPIRATORY_TRACT | 0 refills | Status: DC | PRN

## 2021-01-05 MED ORDER — CETIRIZINE HCL 5 MG PO CHEW: 5.0000 mg | CHEWABLE_TABLET | Freq: Every day | ORAL | 0 refills | Status: DC

## 2021-01-05 NOTE — ED Triage Notes (Signed)
Pt c/o of cough, nasal congestion and SOB x 4 days.

## 2021-01-05 NOTE — ED Provider Notes (Signed)
[facility]   [id number] 01/05/21 Arrival Time: 1619   CC: SOB  SUBJECTIVE: History from: patient.  Cesar Harris is a 70 y.o. male presented to the urgent care with a complaint of cough, nasal congestion and shortness of breath for the past 4 days.  Denies sick exposure to COVID, flu or strep.  Denies recent travel.  Has tried OTC medication without relief.  Denies aggravating factors.  Denies previous symptoms in the past.   Denies fever, chills, fatigue, sinus pain, rhinorrhea, sore throat, , wheezing, chest pain, nausea, changes in bowel or bladder habits.     ROS: As per HPI.  All other pertinent ROS negative.      Past Medical History:  Diagnosis Date  . COVID   . Gout   . History of kidney stones   . Hypertension    Past Surgical History:  Procedure Laterality Date  . BACK SURGERY  1998   l4 anf l5 herniated disc  . CATARACT EXTRACTION W/PHACO  04/03/2012   Procedure: CATARACT EXTRACTION PHACO AND INTRAOCULAR LENS PLACEMENT (IOC);  Surgeon: Gemma Payor, MD;  Location: [facility];  Service: Ophthalmology;  Laterality: Left;  CDE: 8.94  . CATARACT EXTRACTION W/PHACO Right 11/14/2015   Procedure: CATARACT EXTRACTION PHACO AND INTRAOCULAR LENS PLACEMENT RIGHT EYE;  Surgeon: Gemma Payor, MD;  Location: [facility];  Service: Ophthalmology;  Laterality: Right;  CDE:8.19  . COLONOSCOPY N/A 04/19/2016   Procedure: COLONOSCOPY;  Surgeon: Corbin Ade, MD;  Location: [facility];  Service: Endoscopy;  Laterality: N/A;  200 - moved to 1:15 - office to notify  . ESOPHAGOGASTRODUODENOSCOPY N/A 04/19/2016   Procedure: ESOPHAGOGASTRODUODENOSCOPY (EGD);  Surgeon: Corbin Ade, MD;  Location: [facility];  Service: Endoscopy;  Laterality: N/A;  . HERNIA REPAIR     left and right inguinal- [place] [facility]  . MALONEY DILATION N/A 04/19/2016   Procedure: Elease Hashimoto DILATION;  Surgeon: Corbin Ade, MD;  Location: [facility];  Service: Endoscopy;  Laterality: N/A;   No Known Allergies No  current facility-administered medications on file prior to encounter.   Current Outpatient Medications on File Prior to Encounter  Medication Sig Dispense Refill  . amLODipine-benazepril (LOTREL) 5-10 MG per capsule Take 1 capsule by mouth daily.    . cholecalciferol (VITAMIN D) 1000 UNITS tablet Take 1,000 Units by mouth daily. (Patient not taking: Reported on 11/07/2020)    . colchicine (COLCRYS) 0.6 MG tablet TAKE 1 TABLET EVERY 3 TO 4 HOURS UNTIL RELIEF (Patient not taking: Reported on 11/07/2020)    . mirabegron ER (MYRBETRIQ) 25 MG TB24 tablet Take 1 tablet (25 mg total) by mouth daily. 30 tablet 11  . pantoprazole (PROTONIX) 40 MG tablet TAKE 1 TABLET BY MOUTH EVERY DAY 90 tablet 3  . saw palmetto 500 MG capsule Take 900 mg by mouth daily.    . silodosin (RAPAFLO) 8 MG CAPS capsule Take 1 capsule (8 mg total) by mouth at bedtime. 90 capsule 3   Social History   Socioeconomic History  . Marital status: Married    Spouse name: Not on file  . Number of children: Not on file  . Years of education: Not on file  . Highest education level: Not on file  Occupational History  . Not on file  Tobacco Use  . Smoking status: Never Smoker  . Smokeless tobacco: Never Used  Substance and Sexual Activity  . Alcohol use: No    Alcohol/week: 0.0 standard drinks  . Drug  use: No  . Sexual activity: Yes    Birth control/protection: None  Other Topics Concern  . Not on file  Social History Narrative  . Not on file   Social Determinants of Health   Financial Resource Strain: Not on file  Food Insecurity: Not on file  Transportation Needs: Not on file  Physical Activity: Not on file  Stress: Not on file  Social Connections: Not on file  Intimate Partner Violence: Not on file   Family History  Problem Relation Age of Onset  . Anesthesia problems Neg Hx   . Hypotension Neg Hx   . Malignant hyperthermia Neg Hx   . Pseudochol deficiency Neg Hx   . Colon cancer Neg Hx      OBJECTIVE:  Vitals:   01/05/21 1706 01/05/21 1707  BP:  (!) 150/83  Pulse:  81  Resp:  (!) 22  Temp:  98.8 F (37.1 C)  TempSrc:  Oral  SpO2:  93%  Weight: 215 lb (97.5 kg)   Height: 6' (1.829 m)      General appearance: alert; appears fatigued, but nontoxic; speaking in full sentences and tolerating own secretions HEENT: NCAT; Ears: EACs clear, TMs pearly gray; Eyes: PERRL.  EOM grossly intact. Sinuses: nontender; Nose: nares patent without rhinorrhea, Throat: oropharynx clear, tonsils non erythematous or enlarged, uvula midline  Neck: supple without LAD Lungs: unlabored respirations, symmetrical air entry; cough: moderate; no respiratory distress; CTAB Heart: regular rate and rhythm.  Radial pulses 2+ symmetrical bilaterally Skin: warm and dry Psychological: alert and cooperative; normal mood and affect  LABS:  No results found for this or any previous visit (from the past 24 hour(s)).   RADIOLOGY:  DG Chest 2 View  Result Date: 01/05/2021 CLINICAL DATA:  Shortness of breath. EXAM: CHEST - 2 VIEW COMPARISON:  May 24, 2006 FINDINGS: The heart size and mediastinal contours are within normal limits. Both lungs are clear. The visualized skeletal structures are unremarkable. IMPRESSION: No active cardiopulmonary disease. Electronically Signed   By: Constance Holster M.D.   On: 01/05/2021 17:48   Chest x-ray is negative for acute cardiopulmonary disease.  I have reviewed the x-ray myself and the radiologist interpretation.  I am in agreement with the radiologist interpretation.   ASSESSMENT & PLAN:  1. URI with cough and congestion   2. Shortness of breath     Meds ordered this encounter  Medications  . albuterol (VENTOLIN HFA) 108 (90 Base) MCG/ACT inhaler    Sig: Inhale 1-2 puffs into the lungs every 6 (six) hours as needed for wheezing or shortness of breath.    Dispense:  18 g    Refill:  0  . predniSONE (DELTASONE) 10 MG tablet    Sig: Take 2 tablets (20 mg  total) by mouth daily for 5 days.    Dispense:  10 tablet    Refill:  0  . cetirizine (ZYRTEC) 5 MG chewable tablet    Sig: Chew 1 tablet (5 mg total) by mouth daily.    Dispense:  30 tablet    Refill:  0  . benzonatate (TESSALON) 100 MG capsule    Sig: Take 1 capsule (100 mg total) by mouth 3 (three) times daily as needed for cough.    Dispense:  30 capsule    Refill:  0    Discharge instructions  COVID testing ordered.  It will take between 2-7 days for test results.  Someone will contact you regarding abnormal results.    Get  plenty of rest and push fluids Tessalon Perles prescribed for cough Zyrtec for nasal congestion, runny nose, and/or sore throat Prednisone was prescribed Poor air was prescribed for shortness of breath Use medications daily for symptom relief Use OTC medications like ibuprofen or tylenol as needed fever or pain Call or go to the ED if you have any new or worsening symptoms such as fever, worsening cough, shortness of breath, chest tightness, chest pain, turning blue, changes in mental status, etc...   Reviewed expectations re: course of current medical issues. Questions answered. Outlined signs and symptoms indicating need for more acute intervention. Patient verbalized understanding. After Visit Summary given.         Durward Parcel, FNP 01/05/21 1827

## 2021-01-05 NOTE — Discharge Instructions (Addendum)
COVID testing ordered.  It will take between 2-7 days for test results.  Someone will contact you regarding abnormal results.    Get plenty of rest and push fluids Tessalon Perles prescribed for cough Zyrtec for nasal congestion, runny nose, and/or sore throat Prednisone was prescribed Proair was prescribed for shortness of breath Use medications daily for symptom relief Use OTC medications like ibuprofen or tylenol as needed fever or pain Call or go to the ED if you have any new or worsening symptoms such as fever, worsening cough, shortness of breath, chest tightness, chest pain, turning blue, changes in mental status, etc..Marland Kitchen

## 2021-01-07 LAB — COVID-19, FLU A+B NAA
Influenza A, NAA: NOT DETECTED
Influenza B, NAA: NOT DETECTED
SARS-CoV-2, NAA: DETECTED — AB

## 2021-02-17 ENCOUNTER — Other Ambulatory Visit: Payer: Self-pay

## 2021-04-03 ENCOUNTER — Encounter: Payer: Self-pay | Admitting: Internal Medicine

## 2021-05-22 ENCOUNTER — Ambulatory Visit
Admission: EM | Admit: 2021-05-22 | Discharge: 2021-05-22 | Disposition: A | Discharge status: Home / Self Care | Payer: Medicare Other | Attending: Family Medicine | Admitting: Family Medicine

## 2021-05-22 ENCOUNTER — Other Ambulatory Visit: Payer: Self-pay

## 2021-05-22 DIAGNOSIS — M109 Gout, unspecified: Secondary | ICD-10-CM | POA: Diagnosis not present

## 2021-05-22 MED ORDER — KETOROLAC TROMETHAMINE 30 MG/ML IJ SOLN
30.0000 mg | Freq: Once | INTRAMUSCULAR | Status: AC
  Administered 2021-05-22: 30 mg via INTRAMUSCULAR

## 2021-05-22 MED ORDER — DEXAMETHASONE SODIUM PHOSPHATE 10 MG/ML IJ SOLN
10.0000 mg | Freq: Once | INTRAMUSCULAR | Status: AC
  Administered 2021-05-22: 10 mg via INTRAMUSCULAR

## 2021-05-22 NOTE — ED Triage Notes (Signed)
Pt presents with left knee pain for past 2 days, has h/o gout

## 2021-05-22 NOTE — ED Provider Notes (Signed)
[facility]    CSN: [id number] Arrival date & time: 05/22/21  2423      History   Chief Complaint Chief Complaint  Patient presents with  . Knee Pain    HPI Cesar Harris is a 70 y.o. male.   Reports L knee pain for the last 2 days with erythema, swelling and heat. Reports hx gout and thinks that this is what is causing his issue today. Has been taking Colcrys with some relief. Reports that he usually needs a steroid shot as well to help speed the process along. Denies injury, numbness, tingling, popping, cracking of the joint, other symptoms.   ROS per HPI  The history is provided by the patient.  Knee Pain   Past Medical History:  Diagnosis Date  . COVID   . Gout   . History of kidney stones   . Hypertension     Patient Active Problem List   Diagnosis Date Noted  . Elevated PSA 11/07/2020  . Benign localized prostatic hyperplasia with lower urinary tract symptoms (LUTS) 11/07/2020  . Nocturia 11/07/2020  . Barrett's esophagus 06/11/2016  . History of colonic polyps   . Diverticulosis of colon without hemorrhage   . Mucosal abnormality of esophagus   . History of adenomatous polyp of colon 04/09/2016  . Dysphagia 04/09/2016    Past Surgical History:  Procedure Laterality Date  . BACK SURGERY  1998   l4 anf l5 herniated disc  . CATARACT EXTRACTION W/PHACO  04/03/2012   Procedure: CATARACT EXTRACTION PHACO AND INTRAOCULAR LENS PLACEMENT (IOC);  Surgeon: Gemma Payor, MD;  Location: [facility];  Service: Ophthalmology;  Laterality: Left;  CDE: 8.94  . CATARACT EXTRACTION W/PHACO Right 11/14/2015   Procedure: CATARACT EXTRACTION PHACO AND INTRAOCULAR LENS PLACEMENT RIGHT EYE;  Surgeon: Gemma Payor, MD;  Location: [facility];  Service: Ophthalmology;  Laterality: Right;  CDE:8.19  . COLONOSCOPY N/A 04/19/2016   Procedure: COLONOSCOPY;  Surgeon: Corbin Ade, MD;  [facility];  Service: Endoscopy;  Laterality: N/A;  200 - moved to 1:15 - office to  notify  . ESOPHAGOGASTRODUODENOSCOPY N/A 04/19/2016   Procedure: ESOPHAGOGASTRODUODENOSCOPY (EGD);  Surgeon: Corbin Ade, MD;  [facility];  Service: Endoscopy;  Laterality: N/A;  . HERNIA REPAIR     left and right inguinal- [facility] [facility]  . MALONEY DILATION N/A 04/19/2016   Procedure: Elease Hashimoto DILATION;  Surgeon: Corbin Ade, MD;  [facility];  Service: Endoscopy;  Laterality: N/A;       Home Medications    Prior to Admission medications   Medication Sig Start Date End Date Taking? Authorizing Provider  albuterol (VENTOLIN HFA) 108 (90 Base) MCG/ACT inhaler Inhale 1-2 puffs into the lungs every 6 (six) hours as needed for wheezing or shortness of breath. 01/05/21   Avegno, Zachery Dakins, FNP  amLODipine-benazepril (LOTREL) 5-10 MG per capsule Take 1 capsule by mouth daily.    [provider]  benzonatate (TESSALON) 100 MG capsule Take 1 capsule (100 mg total) by mouth 3 (three) times daily as needed for cough. 01/05/21   Avegno, Zachery Dakins, FNP  cetirizine (ZYRTEC) 5 MG chewable tablet Chew 1 tablet (5 mg total) by mouth daily. 01/05/21   Avegno, Zachery Dakins, FNP  cholecalciferol (VITAMIN D) 1000 UNITS tablet Take 1,000 Units by mouth daily. Patient not taking: Reported on 11/07/2020    [provider]  colchicine (COLCRYS) 0.6 MG tablet TAKE 1 TABLET EVERY 3 TO 4 HOURS UNTIL  RELIEF Patient not taking: Reported on 11/07/2020 07/09/19   [provider]  mirabegron ER (MYRBETRIQ) 25 MG TB24 tablet Take 1 tablet (25 mg total) by mouth daily. 12/01/20   McKenzie, Mardene Celeste, MD  pantoprazole (PROTONIX) 40 MG tablet TAKE 1 TABLET BY MOUTH EVERY DAY 09/29/18   Gelene Mink, NP  saw palmetto 500 MG capsule Take 900 mg by mouth daily.    [provider]  silodosin (RAPAFLO) 8 MG CAPS capsule Take 1 capsule (8 mg total) by mouth at bedtime. 11/07/20   McKenzie, Mardene Celeste, MD    Family History Family History  Problem Relation Age of Onset  .  Anesthesia problems Neg Hx   . Hypotension Neg Hx   . Malignant hyperthermia Neg Hx   . Pseudochol deficiency Neg Hx   . Colon cancer Neg Hx     Social History Social History   Tobacco Use  . Smoking status: Never Smoker  . Smokeless tobacco: Never Used  Substance Use Topics  . Alcohol use: No    Alcohol/week: 0.0 standard drinks  . Drug use: No     Allergies   Patient has no known allergies.   Review of Systems Review of Systems   Physical Exam Triage Vital Signs ED Triage Vitals  Enc Vitals Group     BP 05/22/21 0843 (!) 147/92     Pulse Rate 05/22/21 0843 73     Resp 05/22/21 0843 20     Temp 05/22/21 0843 97.7 F (36.5 C)     Temp src --      SpO2 05/22/21 0843 95 %     Weight --      Height --      Head Circumference --      Peak Flow --      Pain Score 05/22/21 0842 7     Pain Loc --      Pain Edu? --      Excl. in GC? --    No data found.  Updated Vital Signs BP (!) 147/92   Pulse 73   Temp 97.7 F (36.5 C)   Resp 20   SpO2 95%   Visual Acuity Right Eye Distance:   Left Eye Distance:   Bilateral Distance:    Right Eye Near:   Left Eye Near:    Bilateral Near:     Physical Exam Vitals and nursing note reviewed.  Constitutional:      General: He is not in acute distress.    Appearance: Normal appearance. He is well-developed and normal weight. He is not ill-appearing.  HENT:     Head: Normocephalic and atraumatic.     Nose: Nose normal.     Mouth/Throat:     Mouth: Mucous membranes are moist.     Pharynx: Oropharynx is clear.  Eyes:     Extraocular Movements: Extraocular movements intact.     Conjunctiva/sclera: Conjunctivae normal.     Pupils: Pupils are equal, round, and reactive to light.  Cardiovascular:     Rate and Rhythm: Normal rate and regular rhythm.     Heart sounds: Normal heart sounds. No murmur heard.   Pulmonary:     Effort: Pulmonary effort is normal. No respiratory distress.     Breath sounds: Normal breath  sounds.  Abdominal:     Palpations: Abdomen is soft.     Tenderness: There is no abdominal tenderness.  Musculoskeletal:        General: Swelling and  tenderness present.     Cervical back: Normal range of motion and neck supple.     Comments: Swelling to anterior L knee, inferiorly with tenderness and erythema    Skin:    General: Skin is warm and dry.     Capillary Refill: Capillary refill takes less than 2 seconds.  Neurological:     General: No focal deficit present.     Mental Status: He is alert and oriented to person, place, and time.  Psychiatric:        Mood and Affect: Mood normal.        Behavior: Behavior normal.        Thought Content: Thought content normal.      UC Treatments / Results  Labs (all labs ordered are listed, but only abnormal results are displayed) Labs Reviewed - No data to display  EKG   Radiology No results found.  Procedures Procedures (including critical care time)  Medications Ordered in UC Medications  dexamethasone (DECADRON) injection 10 mg (10 mg Intramuscular Given 05/22/21 0903)  ketorolac (TORADOL) 30 MG/ML injection 30 mg (30 mg Intramuscular Given 05/22/21 0903)    Initial Impression / Assessment and Plan / UC Course  I have reviewed the triage vital signs and the nursing notes.  Pertinent labs & imaging results that were available during my care of the patient were reviewed by me and considered in my medical decision making (see chart for details).    Gout flare of L knee  Decadron 10mg  IM in office today Toradol 30mg  IM in office today Continue Colcrys as needed Then go back to allopurinol daily, do not take colcrys and allopurinol together Follow up with this office or with primary care if symptoms are persisting.  Follow up in the ER for high fever, trouble swallowing, trouble breathing, other concerning symptoms.   Final Clinical Impressions(s) / UC Diagnoses   Final diagnoses:  Acute gout of left knee,  unspecified cause     Discharge Instructions     You have received a steroid shot and a shot for pain while you were in the office today  Continue colcrys as needed  Follow up with this office or with primary care if symptoms are persisting.  Follow up in the ER for high fever, trouble swallowing, trouble breathing, other concerning symptoms.     ED Prescriptions    None     PDMP not reviewed this encounter.   , NP 05/22/21 [phone]

## 2021-05-22 NOTE — Discharge Instructions (Addendum)
You have received a steroid shot and a shot for pain while you were in the office today  Continue colcrys as needed  Follow up with this office or with primary care if symptoms are persisting.  Follow up in the ER for high fever, trouble swallowing, trouble breathing, other concerning symptoms.

## 2021-06-19 ENCOUNTER — Ambulatory Visit
Admission: EM | Admit: 2021-06-19 | Discharge: 2021-06-19 | Disposition: A | Discharge status: Home / Self Care | Payer: Medicare Other | Attending: Family Medicine | Admitting: Family Medicine

## 2021-06-19 ENCOUNTER — Other Ambulatory Visit: Payer: Self-pay

## 2021-06-19 DIAGNOSIS — M109 Gout, unspecified: Secondary | ICD-10-CM

## 2021-06-19 MED ORDER — KETOROLAC TROMETHAMINE 30 MG/ML IJ SOLN
30.0000 mg | Freq: Once | INTRAMUSCULAR | Status: AC
  Administered 2021-06-19: 30 mg via INTRAMUSCULAR

## 2021-06-19 MED ORDER — DEXAMETHASONE SODIUM PHOSPHATE 10 MG/ML IJ SOLN
10.0000 mg | Freq: Once | INTRAMUSCULAR | Status: AC
  Administered 2021-06-19: 10 mg via INTRAMUSCULAR

## 2021-06-19 NOTE — Discharge Instructions (Addendum)
Meds ordered this encounter  Medications   ketorolac (TORADOL) 30 MG/ML injection 30 mg   dexamethasone (DECADRON) injection 10 mg    

## 2021-06-19 NOTE — ED Provider Notes (Signed)
[facility]   [id number] 06/19/21 Arrival Time: 1132  ASSESSMENT & PLAN:  1. Acute gout of right foot, unspecified cause    Given: Meds ordered this encounter  Medications   ketorolac (TORADOL) 30 MG/ML injection 30 mg   dexamethasone (DECADRON) injection 10 mg   Activities as tolerated.  Recommend:  Follow-up Information     Schedule an appointment as soon as possible for a visit  with Elfredia Nevins, MD.   Specialty: Internal Medicine Contact information: [address] [place] [address] [phone]                 Reviewed expectations re: course of current medical issues. Questions answered. Outlined signs and symptoms indicating need for more acute intervention. Patient verbalized understanding. After Visit Summary given.  SUBJECTIVE: History from: patient. Cesar Harris is a 70 y.o. male who reports R foot pain; distal; no injury/trauma; past two days. H/O gout "and this feels like it". Reports steroid shot usu helps. No extremity sensation changes or weakness. Ambulatory here. No tx PTA.   Past Surgical History:  Procedure Laterality Date   BACK SURGERY  1998   l4 anf l5 herniated disc   CATARACT EXTRACTION W/PHACO  04/03/2012   Procedure: CATARACT EXTRACTION PHACO AND INTRAOCULAR LENS PLACEMENT (IOC);  Surgeon: Gemma Payor, MD;  Location: [facility];  Service: Ophthalmology;  Laterality: Left;  CDE: 8.94   CATARACT EXTRACTION W/PHACO Right 11/14/2015   Procedure: CATARACT EXTRACTION PHACO AND INTRAOCULAR LENS PLACEMENT RIGHT EYE;  Surgeon: Gemma Payor, MD;  Location: [facility];  Service: Ophthalmology;  Laterality: Right;  CDE:8.19   COLONOSCOPY N/A 04/19/2016   Procedure: COLONOSCOPY;  Surgeon: Corbin Ade, MD;  [facility];  Service: Endoscopy;  Laterality: N/A;  200 - moved to 1:15 - office to notify   ESOPHAGOGASTRODUODENOSCOPY N/A 04/19/2016   Procedure: ESOPHAGOGASTRODUODENOSCOPY (EGD);  Surgeon: Corbin Ade, MD;   [facility];  Service: Endoscopy;  Laterality: N/A;   HERNIA REPAIR     left and right inguinal- [facility]   MALONEY DILATION N/A 04/19/2016   Procedure: Elease Hashimoto DILATION;  Surgeon: Corbin Ade, MD;  [facility];  Service: Endoscopy;  Laterality: N/A;      OBJECTIVE:  Vitals:   06/19/21 1139  BP: 126/73  Pulse: 81  Resp: 18  Temp: 98 F (36.7 C)  SpO2: 93%    General appearance: alert; no distress HEENT: [place]; AT Neck: supple with FROM Resp: unlabored respirations Extremities: RLE: warm with well perfused appearance; reports poorly localized pain over distal foot; minimal swelling; normal DP pulse; normal distal sensation and cap refill Skin: warm and dry; no visible rashes Neurologic: gait normal; normal sensation and strength of RLE Psychological: alert and cooperative; normal mood and affect     No Known Allergies  Past Medical History:  Diagnosis Date   COVID    Gout    History of kidney stones    Hypertension    Social History   Socioeconomic History   Marital status: Married    Spouse name: Not on file   Number of children: Not on file   Years of education: Not on file   Highest education level: Not on file  Occupational History   Not on file  Tobacco Use   Smoking status: Never   Smokeless tobacco: Never  Substance and Sexual Activity   Alcohol use: No    Alcohol/week: 0.0 standard drinks   Drug use: No  Sexual activity: Yes    Birth control/protection: None  Other Topics Concern   Not on file  Social History Narrative   Not on file   Social Determinants of Health   Financial Resource Strain: Not on file  Food Insecurity: Not on file  Transportation Needs: Not on file  Physical Activity: Not on file  Stress: Not on file  Social Connections: Not on file   Family History  Problem Relation Age of Onset   Anesthesia problems Neg Hx    Hypotension Neg Hx    Malignant hyperthermia Neg Hx    Pseudochol  deficiency Neg Hx    Colon cancer Neg Hx    Past Surgical History:  Procedure Laterality Date   BACK SURGERY  1998   l4 anf l5 herniated disc   CATARACT EXTRACTION W/PHACO  04/03/2012   Procedure: CATARACT EXTRACTION PHACO AND INTRAOCULAR LENS PLACEMENT (IOC);  Surgeon: Gemma Payor, MD;  Location: [facility];  Service: Ophthalmology;  Laterality: Left;  CDE: 8.94   CATARACT EXTRACTION W/PHACO Right 11/14/2015   Procedure: CATARACT EXTRACTION PHACO AND INTRAOCULAR LENS PLACEMENT RIGHT EYE;  Surgeon: Gemma Payor, MD;  Location: [facility];  Service: Ophthalmology;  Laterality: Right;  CDE:8.19   COLONOSCOPY N/A 04/19/2016   Procedure: COLONOSCOPY;  Surgeon: Corbin Ade, MD;  [facility];  Service: Endoscopy;  Laterality: N/A;  200 - moved to 1:15 - office to notify   ESOPHAGOGASTRODUODENOSCOPY N/A 04/19/2016   Procedure: ESOPHAGOGASTRODUODENOSCOPY (EGD);  Surgeon: Corbin Ade, MD;  [facility];  Service: Endoscopy;  Laterality: N/A;   HERNIA REPAIR     left and right inguinal- [facility]   MALONEY DILATION N/A 04/19/2016   Procedure: Elease Hashimoto DILATION;  Surgeon: Corbin Ade, MD;  [facility];  Service: Endoscopy;  Laterality: N/A;       Mardella Layman, MD 06/19/21 1247

## 2021-06-19 NOTE — ED Triage Notes (Signed)
Pt presents with right foot pain for past few days, has h/o gout  

## 2021-07-05 ENCOUNTER — Other Ambulatory Visit: Payer: Self-pay

## 2021-07-05 ENCOUNTER — Encounter: Payer: Self-pay | Admitting: Gastroenterology

## 2021-07-05 ENCOUNTER — Ambulatory Visit (INDEPENDENT_AMBULATORY_CARE_PROVIDER_SITE_OTHER): Payer: Medicare Other | Admitting: Gastroenterology

## 2021-07-05 VITALS — BP 151/84 | HR 69 | Temp 97.7°F | Ht 72.0 in | Wt 220.2 lb

## 2021-07-05 DIAGNOSIS — K2270 Barrett's esophagus without dysplasia: Secondary | ICD-10-CM

## 2021-07-05 DIAGNOSIS — Z86010 Personal history of colonic polyps: Secondary | ICD-10-CM

## 2021-07-05 NOTE — Patient Instructions (Signed)
Colonoscopy and upper endoscopy to be scheduled. See separate instructions. Continue pantoprazole once every other day.  Barrett's Esophagus  Barrett's esophagus occurs when the tissue that lines the esophagus changes or becomes damaged. The esophagus is the tube that carries food from the throat to the stomach. With Barrett's esophagus, the cells that line the esophagus are replaced by cells that are similar to the lining of the intestines (intestinal metaplasia). Barrett's esophagus itself may not cause any symptoms. However, many people who have Barrett's esophagus also have gastroesophageal reflux disease (GERD), which may cause symptoms such as heartburn. Over time, a few people with this condition may develop cancer of the esophagus. Treatment may include medicines,procedures to destroy the abnormal cells, or surgery. What are the causes? The exact cause of this condition is not known. In some cases, the condition develops from damage to the lining of the esophagus caused by gastroesophageal reflux disease (GERD). GERD occurs when stomach acids flow up from the stomach into the esophagus. Frequent symptoms of GERD may cause intestinal metaplasia or cause cell changes (dysplasia). What increases the risk? You are more likely to develop this condition if you: Have GERD. Are male. Are of European descent. Are obese. Are older than 50. Have a hiatal hernia. This is a condition in which part of your stomach bulges into your chest. Smoke. What are the signs or symptoms? People with Barrett's esophagus often have no symptoms. However, many people with this condition also have GERD. Symptoms of GERD may include: Heartburn. Difficulty swallowing. Dry cough. How is this diagnosed? This condition may be diagnosed based on: Results of an upper gastrointestinal endoscopy. For this exam, a thin, flexible tube with a light and a camera on the end (endoscope) is passed down your esophagus. Your health  care provider can view the inside of your esophagus during this procedure. Results of a biopsy. For this procedure, several tissue samples are removed (biopsy) from your esophagus to look at under a microscope. They are then checked for intestinal metaplasia or dysplasia. How is this treated? Treatment for this condition may include: Medicines (proton pump inhibitors, or PPIs) to decrease or stop GERD. Periodic endoscopic exams to make sure that cancer is not developing. A procedure or surgery for dysplasia. This may include: Removal or destruction of abnormal cells. Removal of part of the esophagus. Follow these instructions at home: Eating and drinking Eat more fruits and vegetables. Avoid fatty foods. Eat small, frequent meals instead of large meals. Avoid foods that cause heartburn. These foods include: Coffee and alcoholic drinks. Tomatoes and foods made with tomatoes. Greasy or spicy foods. Chocolate and peppermint. Do not drink alcohol. General instructions Take over-the-counter and prescription medicines only as told by your health care provider. Do not use any products that contain nicotine or tobacco, such as cigarettes, e-cigarettes, and chewing tobacco. If you need help quitting, ask your health care provider. If you are being treated for GERD, make sure you take medicines and follow all instructions as told by your health care provider. Keep all follow-up visits as told by your health care provider. This is important. Contact a health care provider if: You have heartburn or GERD symptoms. You have difficulty swallowing. Get help right away if: You have chest pain. You are unable to swallow. You vomit blood or material that looks like coffee grounds. Your stool (feces) is bright red or dark. These symptoms may represent a serious problem that is an emergency. Do not wait to see if the  symptoms will go away. Get medical help right away. Call your local emergency services  (911 in the [place] Do not drive yourself to the hospital. Summary Barrett's esophagus occurs when the tissue that lines the esophagus changes or becomes damaged. Barrett's esophagus may be diagnosed with an upper gastrointestinal endoscopy and a biopsy. Treatment may include medicines, procedures to remove abnormal cells, or surgery. Follow your health care provider's instructions about what to eat and drink, what medicines to take, and when to call for help. This information is not intended to replace advice given to you by your health care provider. Make sure you discuss any questions you have with your healthcare provider. Document Revised: 03/05/2020 Document Reviewed: 03/05/2020 Elsevier Patient Education  2022 [employer].

## 2021-07-05 NOTE — Progress Notes (Addendum)
Primary Care Physician:  Elfredia Nevins, MD  Primary Gastroenterologist:  Roetta Sessions, MD   Chief Complaint  Patient presents with   Colonoscopy    Due for tcs    HPI:  Cesar Harris is a 70 y.o. male here to schedule surveillance colonoscopy for history of two tubulovillous adenomas of the colon on prior colonoscopy. He is also overdue for surveillance EGD due to history of Barrett's.   From GI standpoint has been doing well. Currently taking Pantoprazole 40mg  QOD. BM regular. No melena, brbpr. No abdominal pain. No n/v. No dysphagia. No weight loss.   Colonoscopy 03/2016 -Diverticulosis in the sigmoid colon and in the descending colon. -The examination was otherwise normal on direct and retroflexion views. -No specimens collected. -surveillance colonoscopy in 5 years due to h/o polyps  EGD 03/2016 -question short segment Barrett's esophagus. Patent tubular esophagus status post dilation and biopsy as described. -Small hiatal hernia. -Normal stomach. -The examination was otherwise normal. -Normal second portion of the duodenum. -path revealed Barrett's without dysplasia -Recommended 1 year surveillance EGD but patient did not complete.    Current Outpatient Medications  Medication Sig Dispense Refill   amLODipine-benazepril (LOTREL) 5-10 MG per capsule Take 1 capsule by mouth daily.     colchicine 0.6 MG tablet TAKE 1 TABLET EVERY 3 TO 4 HOURS UNTIL RELIEF     pantoprazole (PROTONIX) 40 MG tablet TAKE 1 TABLET BY MOUTH EVERY DAY 90 tablet 3   saw palmetto 500 MG capsule Take 900 mg by mouth daily.     silodosin (RAPAFLO) 8 MG CAPS capsule Take 1 capsule (8 mg total) by mouth at bedtime. 90 capsule 3   No current facility-administered medications for this visit.    Allergies as of 07/05/2021   (No Known Allergies)    Past Medical History:  Diagnosis Date   Barrett esophagus    COVID    Gout    History of kidney stones    Hypertension     Past Surgical  History:  Procedure Laterality Date   BACK SURGERY  1998   l4 anf l5 herniated disc   CATARACT EXTRACTION W/PHACO  04/03/2012   Procedure: CATARACT EXTRACTION PHACO AND INTRAOCULAR LENS PLACEMENT (IOC);  Surgeon: 06/03/2012, MD;  Location: [facility];  Service: Ophthalmology;  Laterality: Left;  CDE: 8.94   CATARACT EXTRACTION W/PHACO Right 11/14/2015   Procedure: CATARACT EXTRACTION PHACO AND INTRAOCULAR LENS PLACEMENT RIGHT EYE;  Surgeon: 11/16/2015, MD;  Location: [facility];  Service: Ophthalmology;  Laterality: Right;  CDE:8.19   COLONOSCOPY N/A 04/19/2016   Procedure: COLONOSCOPY;  Surgeon: 04/21/2016, MD;  Location: [facility];  Service: Endoscopy;  Laterality: N/A;  200 - moved to 1:15 - office to notify   ESOPHAGOGASTRODUODENOSCOPY N/A 04/19/2016   Procedure: ESOPHAGOGASTRODUODENOSCOPY (EGD);  Surgeon: 04/21/2016, MD;  Location: [facility];  Service: Endoscopy;  Laterality: N/A;   HERNIA REPAIR     left and right inguinal- Corbin Ade   MALONEY DILATION N/A 04/19/2016   Procedure: 04/21/2016 DILATION;  Surgeon: Elease Hashimoto, MD;  Location: [facility];  Service: Endoscopy;  Laterality: N/A;    Family History  Problem Relation Age of Onset   Anesthesia problems Neg Hx    Hypotension Neg Hx    Malignant hyperthermia Neg Hx    Pseudochol deficiency Neg Hx    Colon cancer Neg Hx     Social History   Socioeconomic History   Marital status: Married  Spouse name: Not on file   Number of children: Not on file   Years of education: Not on file   Highest education level: Not on file  Occupational History   Not on file  Tobacco Use   Smoking status: Never   Smokeless tobacco: Never  Substance and Sexual Activity   Alcohol use: No    Alcohol/week: 0.0 standard drinks   Drug use: No   Sexual activity: Yes    Birth control/protection: Cesar Harris  Other Topics Concern   Not on file  Social History Narrative   Not on file   Social Determinants of Health   Financial  Resource Strain: Not on file  Food Insecurity: Not on file  Transportation Needs: Not on file  Physical Activity: Not on file  Stress: Not on file  Social Connections: Not on file  Intimate Partner Violence: Not on file      ROS:  General: Negative for anorexia, weight loss, fever, chills, fatigue, weakness. Eyes: Negative for vision changes.  ENT: Negative for hoarseness, difficulty swallowing , nasal congestion. CV: Negative for chest pain, angina, palpitations, dyspnea on exertion, peripheral edema.  Respiratory: Negative for dyspnea at rest, dyspnea on exertion, cough, sputum, wheezing.  GI: See history of present illness. GU:  Negative for dysuria, hematuria, urinary incontinence, urinary frequency, nocturnal urination.  MS: Negative for joint pain, low back pain.  Derm: Negative for rash or itching.  Neuro: Negative for weakness, abnormal sensation, seizure, frequent headaches, memory loss, confusion.  Psych: Negative for anxiety, depression, suicidal ideation, hallucinations.  Endo: Negative for unusual weight change.  Heme: Negative for bruising or bleeding. Allergy: Negative for rash or hives.    Physical Examination:  BP (!) 151/84   Pulse 69   Temp 97.7 F (36.5 C) (Temporal)   Ht 6' (1.829 m)   Wt 220 lb 3.2 oz (99.9 kg)   BMI 29.86 kg/m    General: Well-nourished, well-developed in no acute distress.  Head: Normocephalic, atraumatic.   Eyes: Conjunctiva pink, no icterus. Mouth: masked Neck: Supple without thyromegaly, masses, or lymphadenopathy.  Lungs: Clear to auscultation bilaterally.  Heart: Regular rate and rhythm, no murmurs rubs or gallops.  Abdomen: Bowel sounds are normal, nontender, nondistended, no hepatosplenomegaly or masses, no abdominal bruits or    hernia , no rebound or guarding.   Rectal: not performed Extremities: No lower extremity edema. No clubbing or deformities.  Neuro: Alert and oriented x 4 , grossly normal neurologically.   Skin: Warm and dry, no rash or jaundice.   Psych: Alert and cooperative, normal mood and affect.   Imaging Studies: No results found.   Assessment:  Pleasant 70 y/o male with history of previous advanced adenomatous colon polyps and Barrett's esophagus here for follow up.   H/O colon polyps: last colonoscopy 2017. Advised 5 year surveillance due to prior colonoscopy with two tubulovillous adenomas. No GI complaints.   H/O Barrett's esophagus found on 2017 EGD. Overdue for one year surveillance EGD. No alarm symptoms at this time. GERD well controlled on pantoprazole QOD.    Plan: Colonoscopy and EGD with Dr. Marletta Lor (due to scheduling concerns) with propofol (due to conscious sedation requirements in 2017). ASA II.  I have discussed the risks, alternatives, benefits with regards to but not limited to the risk of reaction to medication, bleeding, infection, perforation and the patient is agreeable to proceed. Written consent to be obtained. Continue pantoprazole 40mg  every other day.

## 2021-07-06 ENCOUNTER — Other Ambulatory Visit: Payer: Self-pay

## 2021-07-06 MED ORDER — PEG 3350-KCL-NA BICARB-NACL 420 G PO SOLR: 4000.0000 mL | ORAL | 0 refills | Status: AC

## 2021-07-16 ENCOUNTER — Encounter: Payer: Self-pay | Admitting: Emergency Medicine

## 2021-07-16 ENCOUNTER — Ambulatory Visit
Admission: EM | Admit: 2021-07-16 | Discharge: 2021-07-16 | Disposition: A | Discharge status: Home / Self Care | Payer: Medicare Other | Attending: Family Medicine | Admitting: Family Medicine

## 2021-07-16 DIAGNOSIS — M1A0710 Idiopathic chronic gout, right ankle and foot, without tophus (tophi): Secondary | ICD-10-CM | POA: Diagnosis not present

## 2021-07-16 DIAGNOSIS — M25471 Effusion, right ankle: Secondary | ICD-10-CM

## 2021-07-16 MED ORDER — KETOROLAC TROMETHAMINE 30 MG/ML IJ SOLN
15.0000 mg | Freq: Once | INTRAMUSCULAR | Status: AC
  Administered 2021-07-16: 15 mg via INTRAMUSCULAR

## 2021-07-16 MED ORDER — PREDNISONE 20 MG PO TABS: 20.0000 mg | ORAL_TABLET | Freq: Every day | ORAL | 0 refills | Status: AC

## 2021-07-16 MED ORDER — DEXAMETHASONE SODIUM PHOSPHATE 10 MG/ML IJ SOLN
10.0000 mg | Freq: Once | INTRAMUSCULAR | Status: AC
  Administered 2021-07-16: 10 mg via INTRAMUSCULAR

## 2021-07-16 NOTE — ED Provider Notes (Signed)
[facility]    CSN: [id number] Arrival date & time: 07/16/21  0818      History   Chief Complaint No chief complaint on file.   HPI Cesar Harris is a 70 y.o. male.   HPI Patient presents today with a concern of gout.  He reports pain at the right ankle radiating to the top of his right foot x3 days.  Patient has been seen here in urgent care with twice within the last 2 months and received steroid and Toradol injections. No recent renal function testing on file.  Patient's medical history is significant for an elevated PSA and Barrett's esophagus along with hypertension. No recent injury to right foot. He plans to follow-up with PCP tomorrow.  Past Medical History:  Diagnosis Date   Barrett esophagus    COVID    Gout    History of kidney stones    Hypertension     Patient Active Problem List   Diagnosis Date Noted   Elevated PSA 11/07/2020   Benign localized prostatic hyperplasia with lower urinary tract symptoms (LUTS) 11/07/2020   Nocturia 11/07/2020   Barrett's esophagus 06/11/2016   History of colonic polyps    Diverticulosis of colon without hemorrhage    Mucosal abnormality of esophagus    History of adenomatous polyp of colon 04/09/2016   Dysphagia 04/09/2016    Past Surgical History:  Procedure Laterality Date   BACK SURGERY  1998   l4 anf l5 herniated disc   CATARACT EXTRACTION W/PHACO  04/03/2012   Procedure: CATARACT EXTRACTION PHACO AND INTRAOCULAR LENS PLACEMENT (IOC);  Surgeon: Gemma Payor, MD;  Location: [facility];  Service: Ophthalmology;  Laterality: Left;  CDE: 8.94   CATARACT EXTRACTION W/PHACO Right 11/14/2015   Procedure: CATARACT EXTRACTION PHACO AND INTRAOCULAR LENS PLACEMENT RIGHT EYE;  Surgeon: Gemma Payor, MD;  Location: [facility];  Service: Ophthalmology;  Laterality: Right;  CDE:8.19   COLONOSCOPY N/A 04/19/2016   Procedure: COLONOSCOPY;  Surgeon: Corbin Ade, MD;  [facility];  Service: Endoscopy;  Laterality: N/A;   200 - moved to 1:15 - office to notify   ESOPHAGOGASTRODUODENOSCOPY N/A 04/19/2016   Procedure: ESOPHAGOGASTRODUODENOSCOPY (EGD);  Surgeon: Corbin Ade, MD;  [facility];  Service: Endoscopy;  Laterality: N/A;   HERNIA REPAIR     left and right inguinal- Royston Bake   MALONEY DILATION N/A 04/19/2016   Procedure: Elease Hashimoto DILATION;  Surgeon: Corbin Ade, MD;  [facility];  Service: Endoscopy;  Laterality: N/A;       Home Medications    Prior to Admission medications   Medication Sig Start Date End Date Taking? Authorizing Provider  predniSONE (DELTASONE) 20 MG tablet Take 1 tablet (20 mg total) by mouth daily with breakfast for 5 doses. 07/16/21 07/21/21 Yes Bing Neighbors, FNP  amLODipine-benazepril (LOTREL) 5-10 MG per capsule Take 1 capsule by mouth daily.    [provider]  colchicine 0.6 MG tablet TAKE 1 TABLET EVERY 3 TO 4 HOURS UNTIL RELIEF 07/09/19   [provider]  pantoprazole (PROTONIX) 40 MG tablet TAKE 1 TABLET BY MOUTH EVERY DAY 09/29/18   Gelene Mink, NP  polyethylene glycol-electrolytes (TRILYTE) 420 g solution Take 4,000 mLs by mouth as directed. 07/06/21   Lanelle Bal, DO  saw palmetto 500 MG capsule Take 900 mg by mouth daily.    [provider]  silodosin (RAPAFLO) 8 MG CAPS capsule Take 1 capsule (8 mg total) by  mouth at bedtime. 11/07/20   McKenzie, Mardene Celeste, MD    Family History Family History  Problem Relation Age of Onset   Anesthesia problems Neg Hx    Hypotension Neg Hx    Malignant hyperthermia Neg Hx    Pseudochol deficiency Neg Hx    Colon cancer Neg Hx     Social History Social History   Tobacco Use   Smoking status: Never   Smokeless tobacco: Never  Substance Use Topics   Alcohol use: No    Alcohol/week: 0.0 standard drinks   Drug use: No     Allergies   Patient has no known allergies.   Review of Systems Review of Systems Pertinent negatives listed in HPI   Physical  Exam Triage Vital Signs ED Triage Vitals [07/16/21 0829]  Enc Vitals Group     BP (!) 148/88     Pulse Rate 76     Resp 18     Temp 98.3 F (36.8 C)     Temp Source Oral     SpO2 94 %     Weight      Height      Head Circumference      Peak Flow      Pain Score 8     Pain Loc      Pain Edu?      Excl. in GC?    No data found.  Updated Vital Signs BP (!) 148/88 (BP Location: Right Arm)   Pulse 76   Temp 98.3 F (36.8 C) (Oral)   Resp 18   SpO2 94%   Visual Acuity Right Eye Distance:   Left Eye Distance:   Bilateral Distance:    Right Eye Near:   Left Eye Near:    Bilateral Near:     Physical Exam General appearance: alert, well developed, well nourished, cooperative  Head: Normocephalic, without obvious abnormality, atraumatic Respiratory: Respirations even and unlabored, normal respiratory rate Heart: Rate and rhythm normal.Abdomen: BS +, no distention, no rebound tenderness, or no mass Extremities: Right ankle swelling, Right foot swelling and pain with movement  Skin: Skin color, texture, turgor normal. No rashes seen  Psych: Appropriate mood and affect. Neurologic: No focal neurological deficits   UC Treatments / Results  Labs (all labs ordered are listed, but only abnormal results are displayed) Labs Reviewed - No data to display  EKG   Radiology No results found.  Procedures Procedures (including critical care time)  Medications Ordered in UC Medications  dexamethasone (DECADRON) injection 10 mg (10 mg Intramuscular Given 07/16/21 0910)  ketorolac (TORADOL) 30 MG/ML injection 15 mg (15 mg Intramuscular Given 07/16/21 0911)    Initial Impression / Assessment and Plan / UC Course  I have reviewed the triage vital signs and the nursing notes.  Pertinent labs & imaging results that were available during my care of the patient were reviewed by me and considered in my medical decision making (see chart for details).    Discussed with patient  the need to follow-up with primary care provider for complete physical along with lab studies for evaluation of renal functioning and A1c as he has received multiple steroid shots here in this clinic for acute gout flares.  Patient reports that he is going to his primary care office tomorrow.  He also has an active prescription for allopurinol however and has not started.  Did advise to discuss with primary care provider after he has labs completed whether or not it is  appropriate to start allopurinol for preventative treatment.  Unfortunately cannot have access to his records as he is a patient at [place] their EMR is not accessible through [facility] health network.  Patient was given Toradol 15 mg IM and Decadron 10 mg IM.  I did send over prednisone oral pack of 20 mg once daily for 5 days.  Patient advised to pick up only if needed and to allow at least 3 to 4 days to see if injections received here in clinic resolved pain.  Keep follow-up with primary doctor.  Return precautions given Final Clinical Impressions(s) / UC Diagnoses   Final diagnoses:  Ankle swelling, right  Chronic gout of right ankle, unspecified cause   Discharge Instructions   None    ED Prescriptions     Medication Sig Dispense Auth. Provider   predniSONE (DELTASONE) 20 MG tablet Take 1 tablet (20 mg total) by mouth daily with breakfast for 5 doses. 5 tablet Bing Neighbors, FNP      PDMP not reviewed this encounter.   Bing Neighbors, [place] 07/16/21 [phone]

## 2021-07-16 NOTE — ED Triage Notes (Signed)
Pain in RT ankle and top of foot for past few days , h/o gout

## 2021-08-08 ENCOUNTER — Other Ambulatory Visit: Payer: Self-pay

## 2021-08-08 ENCOUNTER — Ambulatory Visit (HOSPITAL_COMMUNITY)
Admission: RE | Admit: 2021-08-08 | Discharge: 2021-08-08 | Disposition: A | Discharge status: Home / Self Care | Payer: Medicare Other | Attending: Internal Medicine | Admitting: Internal Medicine

## 2021-08-08 ENCOUNTER — Encounter (HOSPITAL_COMMUNITY): Payer: Self-pay

## 2021-08-08 ENCOUNTER — Encounter (HOSPITAL_COMMUNITY)
Admission: RE | Disposition: A | Discharge status: Home / Self Care | Payer: Self-pay | Source: Home / Self Care | Attending: Internal Medicine

## 2021-08-08 ENCOUNTER — Ambulatory Visit (HOSPITAL_COMMUNITY): Payer: Medicare Other | Admitting: Certified Registered Nurse Anesthetist

## 2021-08-08 DIAGNOSIS — Z1211 Encounter for screening for malignant neoplasm of colon: Secondary | ICD-10-CM | POA: Diagnosis present

## 2021-08-08 DIAGNOSIS — Z86010 Personal history of colonic polyps: Secondary | ICD-10-CM | POA: Insufficient documentation

## 2021-08-08 DIAGNOSIS — Z79899 Other long term (current) drug therapy: Secondary | ICD-10-CM | POA: Diagnosis not present

## 2021-08-08 DIAGNOSIS — K2270 Barrett's esophagus without dysplasia: Secondary | ICD-10-CM

## 2021-08-08 DIAGNOSIS — Z7952 Long term (current) use of systemic steroids: Secondary | ICD-10-CM | POA: Insufficient documentation

## 2021-08-08 DIAGNOSIS — K648 Other hemorrhoids: Secondary | ICD-10-CM | POA: Diagnosis not present

## 2021-08-08 DIAGNOSIS — Z8719 Personal history of other diseases of the digestive system: Secondary | ICD-10-CM | POA: Diagnosis not present

## 2021-08-08 DIAGNOSIS — Z8616 Personal history of COVID-19: Secondary | ICD-10-CM | POA: Diagnosis not present

## 2021-08-08 DIAGNOSIS — K5730 Diverticulosis of large intestine without perforation or abscess without bleeding: Secondary | ICD-10-CM | POA: Diagnosis not present

## 2021-08-08 HISTORY — PX: COLONOSCOPY WITH PROPOFOL: SHX5780

## 2021-08-08 HISTORY — PX: BIOPSY: SHX5522

## 2021-08-08 HISTORY — PX: ESOPHAGOGASTRODUODENOSCOPY (EGD) WITH PROPOFOL: SHX5813

## 2021-08-08 SURGERY — COLONOSCOPY WITH PROPOFOL
Anesthesia: Regional

## 2021-08-08 MED ORDER — LIDOCAINE HCL (CARDIAC) PF 100 MG/5ML IV SOSY
PREFILLED_SYRINGE | INTRAVENOUS | Status: DC | PRN
  Administered 2021-08-08: 50 mg via INTRAVENOUS

## 2021-08-08 MED ORDER — LACTATED RINGERS IV SOLN: INTRAVENOUS | Status: DC | PRN

## 2021-08-08 MED ORDER — PROPOFOL 500 MG/50ML IV EMUL
INTRAVENOUS | Status: DC | PRN
  Administered 2021-08-08: 150 ug/kg/min via INTRAVENOUS

## 2021-08-08 MED ORDER — PROPOFOL 10 MG/ML IV BOLUS
INTRAVENOUS | Status: DC | PRN
  Administered 2021-08-08: 100 mg via INTRAVENOUS

## 2021-08-08 NOTE — Transfer of Care (Signed)
Immediate Anesthesia Transfer of Care Note  Patient: Cesar Harris  Procedure(s) Performed: COLONOSCOPY WITH PROPOFOL ESOPHAGOGASTRODUODENOSCOPY (EGD) WITH PROPOFOL BIOPSY  Patient Location: Endoscopy Unit  Anesthesia Type:General  Level of Consciousness: awake  Airway & Oxygen Therapy: Patient Spontanous Breathing  Post-op Assessment: Report given to RN and Post -op Vital signs reviewed and stable  Post vital signs: Reviewed and stable  Last Vitals:  Vitals Value Taken Time  BP    Temp 36.7 C 08/08/21 0748  Pulse    Resp 13 08/08/21 0748  SpO2 92 % 08/08/21 0748    Last Pain:  Vitals:   08/08/21 0748  TempSrc: Oral  PainSc: 0-No pain      Patients Stated Pain Goal: 7 (08/08/21 9169)  Complications: No notable events documented.

## 2021-08-08 NOTE — Discharge Instructions (Addendum)
EGD Discharge instructions Please read the instructions outlined below and refer to this sheet in the next few weeks. These discharge instructions provide you with general information on caring for yourself after you leave the hospital. Your doctor may also give you specific instructions. While your treatment has been planned according to the most current medical practices available, unavoidable complications occasionally occur. If you have any problems or questions after discharge, please call your doctor. ACTIVITY You may resume your regular activity but move at a slower pace for the next 24 hours.  Take frequent rest periods for the next 24 hours.  Walking will help expel (get rid of) the air and reduce the bloated feeling in your abdomen.  No driving for 24 hours (because of the anesthesia (medicine) used during the test).  You may shower.  Do not sign any important legal documents or operate any machinery for 24 hours (because of the anesthesia used during the test).  NUTRITION Drink plenty of fluids.  You may resume your normal diet.  Begin with a light meal and progress to your normal diet.  Avoid alcoholic beverages for 24 hours or as instructed by your caregiver.  MEDICATIONS You may resume your normal medications unless your caregiver tells you otherwise.  WHAT YOU CAN EXPECT TODAY You may experience abdominal discomfort such as a feeling of fullness or "gas" pains.  FOLLOW-UP Your doctor will discuss the results of your test with you.  SEEK IMMEDIATE MEDICAL ATTENTION IF ANY OF THE FOLLOWING OCCUR: Excessive nausea (feeling sick to your stomach) and/or vomiting.  Severe abdominal pain and distention (swelling).  Trouble swallowing.  Temperature over 101 F (37.8 C).  Rectal bleeding or vomiting of blood.      Colonoscopy Discharge Instructions  Read the instructions outlined below and refer to this sheet in the next few weeks. These discharge instructions provide you  with general information on caring for yourself after you leave the hospital. Your doctor may also give you specific instructions. While your treatment has been planned according to the most current medical practices available, unavoidable complications occasionally occur.   ACTIVITY You may resume your regular activity, but move at a slower pace for the next 24 hours.  Take frequent rest periods for the next 24 hours.  Walking will help get rid of the air and reduce the bloated feeling in your belly (abdomen).  No driving for 24 hours (because of the medicine (anesthesia) used during the test).   Do not sign any important legal documents or operate any machinery for 24 hours (because of the anesthesia used during the test).  NUTRITION Drink plenty of fluids.  You may resume your normal diet as instructed by your doctor.  Begin with a light meal and progress to your normal diet. Heavy or fried foods are harder to digest and may make you feel sick to your stomach (nauseated).  Avoid alcoholic beverages for 24 hours or as instructed.  MEDICATIONS You may resume your normal medications unless your doctor tells you otherwise.  WHAT YOU CAN EXPECT TODAY Some feelings of bloating in the abdomen.  Passage of more gas than usual.  Spotting of blood in your stool or on the toilet paper.  IF YOU HAD POLYPS REMOVED DURING THE COLONOSCOPY: No aspirin products for 7 days or as instructed.  No alcohol for 7 days or as instructed.  Eat a soft diet for the next 24 hours.  FINDING OUT THE RESULTS OF YOUR TEST Not all test results  are available during your visit. If your test results are not back during the visit, make an appointment with your caregiver to find out the results. Do not assume everything is normal if you have not heard from your caregiver or the medical facility. It is important for you to follow up on all of your test results.  SEEK IMMEDIATE MEDICAL ATTENTION IF: You have more than a  spotting of blood in your stool.  Your belly is swollen (abdominal distention).  You are nauseated or vomiting.  You have a temperature over 101.  You have abdominal pain or discomfort that is severe or gets worse throughout the day.   Your EGD was relatively unremarkable.  Stomach and small bowel both appeared normal.  Barrett's esophagus is stable, still short segment.  I did rebiopsy this today.  We will plan on repeat EGD in 5 years.  Your colonoscopy was relatively unremarkable.  I did not find any polyps or evidence of colon cancer.  I recommend repeating colonoscopy in 5 years given your history.  You do have diverticulosis and internal hemorrhoids. I would recommend increasing fiber in your diet or adding OTC Benefiber/Metamucil. Be sure to drink at least 4 to 6 glasses of water daily. Follow-up with GI as needed.   I hope you have a great rest of your week!  Hennie Duos. Marletta Lor, D.O. Gastroenterology and Hepatology [facility]

## 2021-08-08 NOTE — Anesthesia Preprocedure Evaluation (Addendum)
Anesthesia Evaluation  Patient identified by MRN, date of birth, ID band Patient awake    Reviewed: Allergy & Precautions, NPO status , Patient's Chart, lab work & pertinent test results  Airway Mallampati: I  TM Distance: >3 FB Neck ROM: Full    Dental no notable dental hx.    Pulmonary neg pulmonary ROS,    Pulmonary exam normal breath sounds clear to auscultation       Cardiovascular hypertension, negative cardio ROS Normal cardiovascular exam Rhythm:Regular Rate:Normal     Neuro/Psych negative neurological ROS  negative psych ROS   GI/Hepatic negative GI ROS, Neg liver ROS,   Endo/Other  negative endocrine ROS  Renal/GU negative Renal ROS  negative genitourinary   Musculoskeletal negative musculoskeletal ROS (+)   Abdominal   Peds negative pediatric ROS (+)  Hematology negative hematology ROS (+)   Anesthesia Other Findings   Reproductive/Obstetrics negative OB ROS                             Anesthesia Physical Anesthesia Plan  ASA: 3  Anesthesia Plan: General   Post-op Pain Management:    Induction: Intravenous  PONV Risk Score and Plan:   Airway Management Planned:   Additional Equipment:   Intra-op Plan:   Post-operative Plan:   Informed Consent: I have reviewed the patients History and Physical, chart, labs and discussed the procedure including the risks, benefits and alternatives for the proposed anesthesia with the patient or authorized representative who has indicated his/her understanding and acceptance.       Plan Discussed with: CRNA  Anesthesia Plan Comments:        Anesthesia Quick Evaluation

## 2021-08-08 NOTE — Op Note (Signed)
[facility] Patient Name: Cesar Harris Procedure Date: 08/08/2021 7:34 AM MRN: [id number] Date of Birth: 1951/09/02 Attending MD: Elon Alas. Abbey Chatters DO CSN: [id number] Age: 70 Admit Type: Outpatient Procedure:                Colonoscopy Indications:              High risk colon cancer surveillance: Personal                            history of adenoma with villous component Providers:                Elon Alas. Abbey Chatters, DO, Gwenlyn Fudge, RN, Randa Spike, Technician Referring MD:              Medicines:                See the Anesthesia note for documentation of the                            administered medications Complications:            No immediate complications. Estimated Blood Loss:     Estimated blood loss: none. Procedure:                Pre-Anesthesia Assessment:                           - The anesthesia plan was to use monitored                            anesthesia care (MAC).                           After obtaining informed consent, the colonoscope                            was passed under direct vision. Throughout the                            procedure, the patient's blood pressure, pulse, and                            oxygen saturations were monitored continuously. The                            [id number]-HQ190L ([id number]) scope was introduced through                            the anus and advanced to the the cecum, identified                            by appendiceal orifice and ileocecal valve. The                            colonoscopy was performed without difficulty. The  patient tolerated the procedure well. The quality                            of the bowel preparation was evaluated using the                            BBPS [facility] Bowel Preparation Scale) with scores                            of: Right Colon = 3, Transverse Colon = 3 and Left                            Colon = 3 (entire mucosa  seen well with no residual                            staining, small fragments of stool or opaque                            liquid). The total BBPS score equals 9. Scope In: 7:35:31 AM Scope Out: 7:45:28 AM Scope Withdrawal Time: 0 hours 7 minutes 34 seconds  Total Procedure Duration: 0 hours 9 minutes 57 seconds  Findings:      The perianal and digital rectal examinations were normal.      Non-bleeding internal hemorrhoids were found during endoscopy.      Multiple small-mouthed diverticula were found in the sigmoid colon.      The exam was otherwise without abnormality. Impression:               - Non-bleeding internal hemorrhoids.                           - Diverticulosis in the sigmoid colon.                           - The examination was otherwise normal.                           - No specimens collected. Moderate Sedation:      Per Anesthesia Care Recommendation:           - Patient has a contact number available for                            emergencies. The signs and symptoms of potential                            delayed complications were discussed with the                            patient. Return to normal activities tomorrow.                            Written discharge instructions were provided to the  patient.                           - Resume previous diet.                           - Continue present medications.                           - Await pathology results.                           - Repeat colonoscopy in 5 years for surveillance.                           - Return to GI clinic PRN. Procedure Code(s):        --- Professional ---                           L8921, Colorectal cancer screening; colonoscopy on                            individual at high risk Diagnosis Code(s):        --- Professional ---                           Z86.010, Personal history of colonic polyps                           K64.8, Other hemorrhoids                            K57.30, Diverticulosis of large intestine without                            perforation or abscess without bleeding CPT copyright 2019 American Medical Association. All rights reserved. The codes documented in this report are preliminary and upon coder review may  be revised to meet current compliance requirements. Elon Alas. Abbey Chatters, DO Wayne Abbey Chatters, DO 08/08/2021 7:49:41 AM This report has been signed electronically. Number of Addenda: 0

## 2021-08-08 NOTE — Anesthesia Postprocedure Evaluation (Signed)
Anesthesia Post Note  Patient: Zachary George  Procedure(s) Performed: COLONOSCOPY WITH PROPOFOL ESOPHAGOGASTRODUODENOSCOPY (EGD) WITH PROPOFOL BIOPSY  Patient location during evaluation: PACU Anesthesia Type: Regional Level of consciousness: awake and alert Pain management: pain level controlled Vital Signs Assessment: post-procedure vital signs reviewed and stable Respiratory status: spontaneous breathing, nonlabored ventilation, respiratory function stable and patient connected to nasal cannula oxygen Cardiovascular status: blood pressure returned to baseline and stable Postop Assessment: no apparent nausea or vomiting Anesthetic complications: no   No notable events documented.   Last Vitals:  Vitals:   08/08/21 0652 08/08/21 0748  BP: (!) 141/88 (!) 111/55  Pulse: 66   Resp: 14 13  Temp: 36.7 C 36.7 C  SpO2: 96% 92%    Last Pain:  Vitals:   08/08/21 0748  TempSrc: Oral  PainSc: 0-No pain                 Shona Needles

## 2021-08-08 NOTE — H&P (Signed)
Primary Care Physician:  Elfredia Nevins, MD Primary Gastroenterologist:  Dr. Marletta Lor  Pre-Procedure History & Physical: HPI:  Cesar Harris is a 70 y.o. male is here for an EGD for Barrett's surveillance, currently on pantoprazole and a colonoscopy to be performed for surveillance due to personal history of tubulovillous adenomas.   Past Medical History:  Diagnosis Date   Barrett esophagus    COVID    Gout    History of kidney stones    Hypertension     Past Surgical History:  Procedure Laterality Date   BACK SURGERY  1998   l4 anf l5 herniated disc   CATARACT EXTRACTION W/PHACO  04/03/2012   Procedure: CATARACT EXTRACTION PHACO AND INTRAOCULAR LENS PLACEMENT (IOC);  Surgeon: Gemma Payor, MD;  Location: [facility];  Service: Ophthalmology;  Laterality: Left;  CDE: 8.94   CATARACT EXTRACTION W/PHACO Right 11/14/2015   Procedure: CATARACT EXTRACTION PHACO AND INTRAOCULAR LENS PLACEMENT RIGHT EYE;  Surgeon: Gemma Payor, MD;  Location: [facility];  Service: Ophthalmology;  Laterality: Right;  CDE:8.19   COLONOSCOPY N/A 04/19/2016   Procedure: COLONOSCOPY;  Surgeon: Corbin Ade, MD;  Location: [facility];  Service: Endoscopy;  Laterality: N/A;  200 - moved to 1:15 - office to notify   ESOPHAGOGASTRODUODENOSCOPY N/A 04/19/2016   Procedure: ESOPHAGOGASTRODUODENOSCOPY (EGD);  Surgeon: Corbin Ade, MD;  Location: [facility];  Service: Endoscopy;  Laterality: N/A;   HERNIA REPAIR     left and right inguinal- Royston Bake   MALONEY DILATION N/A 04/19/2016   Procedure: Elease Hashimoto DILATION;  Surgeon: Corbin Ade, MD;  Location: [facility];  Service: Endoscopy;  Laterality: N/A;    Prior to Admission medications   Medication Sig Start Date End Date Taking? Authorizing Provider  amLODipine-benazepril (LOTREL) 5-10 MG per capsule Take 1 capsule by mouth daily.   Yes [provider]  pantoprazole (PROTONIX) 40 MG tablet TAKE 1 TABLET BY MOUTH EVERY DAY 09/29/18  Yes Gelene Mink, NP   SAW PALMETTO, SERENOA REPENS, PO Take 900 mg by mouth 2 (two) times daily.   Yes [provider]  colchicine 0.6 MG tablet Take 0.6 mg by mouth daily as needed (gout). 07/09/19   [provider]  polyethylene glycol-electrolytes (TRILYTE) 420 g solution Take 4,000 mLs by mouth as directed. 07/06/21   Lanelle Bal, DO  predniSONE (DELTASONE) 20 MG tablet Take 20 mg by mouth daily as needed (Gout).    [provider]  silodosin (RAPAFLO) 8 MG CAPS capsule Take 1 capsule (8 mg total) by mouth at bedtime. 11/07/20   Malen Gauze, MD    Allergies as of 07/06/2021   (No Known Allergies)    Family History  Problem Relation Age of Onset   Anesthesia problems Neg Hx    Hypotension Neg Hx    Malignant hyperthermia Neg Hx    Pseudochol deficiency Neg Hx    Colon cancer Neg Hx     Social History   Socioeconomic History   Marital status: Married    Spouse name: Not on file   Number of children: Not on file   Years of education: Not on file   Highest education level: Not on file  Occupational History   Not on file  Tobacco Use   Smoking status: Never   Smokeless tobacco: Never  Vaping Use   Vaping Use: Never used  Substance and Sexual Activity   Alcohol use: No    Alcohol/week: 0.0 standard drinks  Drug use: No   Sexual activity: Yes    Birth control/protection: None  Other Topics Concern   Not on file  Social History Narrative   Not on file   Social Determinants of Health   Financial Resource Strain: Not on file  Food Insecurity: Not on file  Transportation Needs: Not on file  Physical Activity: Not on file  Stress: Not on file  Social Connections: Not on file  Intimate Partner Violence: Not on file    Review of Systems: See HPI, otherwise negative ROS  Physical Exam: Vital signs in last 24 hours: Temp:  [98 F (36.7 C)] 98 F (36.7 C) (08/09 0652) Pulse Rate:  [66] 66 (08/09 0652) Resp:  [14] 14 (08/09 0652) BP: (141)/(88)  141/88 (08/09 0652) SpO2:  [96 %] 96 % (08/09 0652) Weight:  [98.4 kg] 98.4 kg (08/09 0652)   General:   Alert,  Well-developed, well-nourished, pleasant and cooperative in NAD Head:  Normocephalic and atraumatic. Eyes:  Sclera clear, no icterus.   Conjunctiva pink. Ears:  Normal auditory acuity. Nose:  No deformity, discharge,  or lesions. Mouth:  No deformity or lesions, dentition normal. Neck:  Supple; no masses or thyromegaly. Lungs:  Clear throughout to auscultation.   No wheezes, crackles, or rhonchi. No acute distress. Heart:  Regular rate and rhythm; no murmurs, clicks, rubs,  or gallops. Abdomen:  Soft, nontender and nondistended. No masses, hepatosplenomegaly or hernias noted. Normal bowel sounds, without guarding, and without rebound.   Msk:  Symmetrical without gross deformities. Normal posture. Extremities:  Without clubbing or edema. Neurologic:  Alert and  oriented x4;  grossly normal neurologically. Skin:  Intact without significant lesions or rashes. Cervical Nodes:  No significant cervical adenopathy. Psych:  Alert and cooperative. Normal mood and affect.  Impression/Plan: Cesar Harris is here for an EGD for Barrett's surveillance, currently on pantoprazole and a colonoscopy to be performed for surveillance due to personal history of tubulovillous adenomas.   The risks of the procedure including infection, bleed, or perforation as well as benefits, limitations, alternatives and imponderables have been reviewed with the patient. Questions have been answered. All parties agreeable.

## 2021-08-08 NOTE — Op Note (Signed)
[facility] Patient Name: Cesar Harris Procedure Date: 08/08/2021 7:19 AM MRN: [id number] Date of Birth: 1951-08-19 Attending MD: Elon Alas. Abbey Chatters DO CSN: [id number] Age: 70 Admit Type: Outpatient Procedure:                Upper GI endoscopy Indications:              Surveillance for malignancy due to personal history                            of Barrett's esophagus Providers:                Elon Alas. Abbey Chatters, DO, Gwenlyn Fudge, RN, Randa Spike, Technician Referring MD:              Medicines:                See the Anesthesia note for documentation of the                            administered medications Complications:            No immediate complications. Estimated Blood Loss:     Estimated blood loss was minimal. Procedure:                Pre-Anesthesia Assessment:                           - The anesthesia plan was to use monitored                            anesthesia care (MAC).                           After obtaining informed consent, the endoscope was                            passed under direct vision. Throughout the                            procedure, the patient's blood pressure, pulse, and                            oxygen saturations were monitored continuously. The                            GIF-H190 ([id number]) scope was introduced through the                            mouth, and advanced to the second part of duodenum.                            The upper GI endoscopy was accomplished without                            difficulty. The patient tolerated  the procedure                            well. Scope In: 7:27:50 AM Scope Out: 7:31:34 AM Total Procedure Duration: 0 hours 3 minutes 44 seconds  Findings:      The Z-line was irregular and was found 44 cm from the incisors. Biopsies       were taken with a cold forceps for histology.      The entire examined stomach was normal.      The duodenal bulb, first portion of  the duodenum and second portion of       the duodenum were normal. Impression:               - Z-line irregular, 44 cm from the incisors.                            Biopsied.                           - Normal stomach.                           - Normal duodenal bulb, first portion of the                            duodenum and second portion of the duodenum. Moderate Sedation:      Per Anesthesia Care Recommendation:           - Patient has a contact number available for                            emergencies. The signs and symptoms of potential                            delayed complications were discussed with the                            patient. Return to normal activities tomorrow.                            Written discharge instructions were provided to the                            patient.                           - Resume previous diet.                           - Continue present medications.                           - Await pathology results.                           - Repeat upper endoscopy in 3 - 5 years for  surveillance.                           - Return to GI clinic PRN.                           - Use Protonix (pantoprazole) 40 mg PO daily. Procedure Code(s):        --- Professional ---                           [phone], Esophagogastroduodenoscopy, flexible,                            transoral; with biopsy, single or multiple Diagnosis Code(s):        --- Professional ---                           K22.8, Other specified diseases of esophagus                           K22.70, Barrett's esophagus without dysplasia CPT copyright 2019 American Medical Association. All rights reserved. The codes documented in this report are preliminary and upon coder review may  be revised to meet current compliance requirements. Elon Alas. Abbey Chatters, DO Teresita Abbey Chatters, DO 08/08/2021 7:33:56 AM This report has been signed electronically. Number of  Addenda: 0

## 2021-08-09 LAB — SURGICAL PATHOLOGY

## 2021-08-15 ENCOUNTER — Encounter (HOSPITAL_COMMUNITY): Payer: Self-pay | Admitting: Internal Medicine

## 2021-08-26 ENCOUNTER — Other Ambulatory Visit: Payer: Self-pay

## 2021-08-26 ENCOUNTER — Ambulatory Visit
Admission: EM | Admit: 2021-08-26 | Discharge: 2021-08-26 | Disposition: A | Discharge status: Home / Self Care | Payer: Medicare Other | Attending: Emergency Medicine | Admitting: Emergency Medicine

## 2021-08-26 DIAGNOSIS — M109 Gout, unspecified: Secondary | ICD-10-CM | POA: Diagnosis not present

## 2021-08-26 MED ORDER — KETOROLAC TROMETHAMINE 60 MG/2ML IM SOLN
60.0000 mg | Freq: Once | INTRAMUSCULAR | Status: AC
Start: 2021-08-26 — End: 2021-08-26
  Administered 2021-08-26: 60 mg via INTRAMUSCULAR

## 2021-08-26 MED ORDER — DEXAMETHASONE SODIUM PHOSPHATE 10 MG/ML IJ SOLN
10.0000 mg | Freq: Once | INTRAMUSCULAR | Status: AC
  Administered 2021-08-26: 10 mg via INTRAMUSCULAR

## 2021-08-26 NOTE — ED Triage Notes (Signed)
3 day h/o gout flare up in his right knee. Pt reports that he is on his 4th day of prednisone without significant relief.

## 2021-08-26 NOTE — Discharge Instructions (Signed)
Continue conservative management of rest, ice, and gentle stretches Steroid and toradol shots given in office Follow up with PCP if symptoms persist Return or go to the ER if you have any new or worsening symptoms (fever, chills, chest pain, abdominal pain, changes in bowel or bladder habits, pain radiating into lower legs, etc...)

## 2021-08-26 NOTE — ED Provider Notes (Signed)
[facility]   [id number] 08/26/21 Arrival Time: 1318  CC: RT knee pain  SUBJECTIVE: History from: patient. Cesar Harris is a 70 y.o. male complains of RT knee pain x 3 days.  Reports gout flare.  Localizes the pain to the front of RT knee.  Describes the pain as constant, sharp, dull, and achy in character.  Has tried colchicine and prednisone without relief.  Symptoms are made worse with the touch.  Reports similar symptoms in the past.  Denies fever, chills, erythema, ecchymosis, effusion, weakness, numbness and tingling.  ROS: As per HPI.  All other pertinent ROS negative.     Past Medical History:  Diagnosis Date   Barrett esophagus    COVID    Gout    History of kidney stones    Hypertension    Past Surgical History:  Procedure Laterality Date   BACK SURGERY  1998   l4 anf l5 herniated disc   BIOPSY  08/08/2021   Procedure: BIOPSY;  Surgeon: Lanelle Bal, DO;  [facility]: [facility];  Service: Endoscopy;;   CATARACT EXTRACTION W/PHACO  04/03/2012   Procedure: CATARACT EXTRACTION PHACO AND INTRAOCULAR LENS PLACEMENT (IOC);  Surgeon: Gemma Payor, MD;  Location: [facility];  Service: Ophthalmology;  Laterality: Left;  CDE: 8.94   CATARACT EXTRACTION W/PHACO Right 11/14/2015   Procedure: CATARACT EXTRACTION PHACO AND INTRAOCULAR LENS PLACEMENT RIGHT EYE;  Surgeon: Gemma Payor, MD;  Location: [facility];  Service: Ophthalmology;  Laterality: Right;  CDE:8.19   COLONOSCOPY N/A 04/19/2016   Procedure: COLONOSCOPY;  Surgeon: Corbin Ade, MD;  [facility]: [facility];  Service: Endoscopy;  Laterality: N/A;  200 - moved to 1:15 - office to notify   COLONOSCOPY WITH PROPOFOL N/A 08/08/2021   Procedure: COLONOSCOPY WITH PROPOFOL;  Surgeon: Lanelle Bal, DO;  [facility];  Service: Endoscopy;  Laterality: N/A;  7:30am   ESOPHAGOGASTRODUODENOSCOPY N/A 04/19/2016   Procedure: ESOPHAGOGASTRODUODENOSCOPY (EGD);  Surgeon: Corbin Ade, MD;  [facility];   Service: Endoscopy;  Laterality: N/A;   ESOPHAGOGASTRODUODENOSCOPY (EGD) WITH PROPOFOL N/A 08/08/2021   Procedure: ESOPHAGOGASTRODUODENOSCOPY (EGD) WITH PROPOFOL;  Surgeon: Lanelle Bal, DO;  [facility];  Service: Endoscopy;  Laterality: N/A;   HERNIA REPAIR     left and right inguinal- Royston Bake   MALONEY DILATION N/A 04/19/2016   Procedure: Elease Hashimoto DILATION;  Surgeon: Corbin Ade, MD;  [facility];  Service: Endoscopy;  Laterality: N/A;   No Known Allergies No current facility-administered medications on file prior to encounter.   Current Outpatient Medications on File Prior to Encounter  Medication Sig Dispense Refill   amLODipine-benazepril (LOTREL) 5-10 MG per capsule Take 1 capsule by mouth daily.     colchicine 0.6 MG tablet Take 0.6 mg by mouth daily as needed (gout).     pantoprazole (PROTONIX) 40 MG tablet TAKE 1 TABLET BY MOUTH EVERY DAY 90 tablet 3   polyethylene glycol-electrolytes (TRILYTE) 420 g solution Take 4,000 mLs by mouth as directed. 4000 mL 0   predniSONE (DELTASONE) 20 MG tablet Take 20 mg by mouth daily as needed (Gout).     SAW PALMETTO, SERENOA REPENS, PO Take 900 mg by mouth 2 (two) times daily.     silodosin (RAPAFLO) 8 MG CAPS capsule Take 1 capsule (8 mg total) by mouth at bedtime. 90 capsule 3   Social History   Socioeconomic History   Marital status: Married    Spouse name:  Not on file   Number of children: Not on file   Years of education: Not on file   Highest education level: Not on file  Occupational History   Not on file  Tobacco Use   Smoking status: Never   Smokeless tobacco: Never  Vaping Use   Vaping Use: Never used  Substance and Sexual Activity   Alcohol use: No    Alcohol/week: 0.0 standard drinks   Drug use: No   Sexual activity: Yes    Birth control/protection: None  Other Topics Concern   Not on file  Social History Narrative   Not on file   Social Determinants of Health   Financial  Resource Strain: Not on file  Food Insecurity: Not on file  Transportation Needs: Not on file  Physical Activity: Not on file  Stress: Not on file  Social Connections: Not on file  Intimate Partner Violence: Not on file   Family History  Problem Relation Age of Onset   Anesthesia problems Neg Hx    Hypotension Neg Hx    Malignant hyperthermia Neg Hx    Pseudochol deficiency Neg Hx    Colon cancer Neg Hx     OBJECTIVE:  Vitals:   08/26/21 1410  BP: (!) 145/87  Pulse: 61  Resp: 16  Temp: 98.5 F (36.9 C)  TempSrc: Oral  SpO2: 94%    General appearance: ALERT; in no acute distress.  Head: NCAT Lungs: Normal respiratory effort Musculoskeletal: RT knee Inspection: Some erythema, warm to the touch Palpation: TTP over RT medial inferior knee ROM: FROM active and passive Strength:  5/5 knee flexion, 5/5 knee extension Skin: warm and dry Neurologic: Ambulates without difficulty; Sensation intact about the upper/ lower extremities Psychological: alert and cooperative; normal mood and affect   ASSESSMENT & PLAN:  1. Acute gout of right knee, unspecified cause     Meds ordered this encounter  Medications   dexamethasone (DECADRON) injection 10 mg   ketorolac (TORADOL) injection 60 mg    Continue conservative management of rest, ice, and gentle stretches Steroid and toradol shots given in office Follow up with PCP if symptoms persist Return or go to the ER if you have any new or worsening symptoms (fever, chills, chest pain, abdominal pain, changes in bowel or bladder habits, pain radiating into lower legs, etc...)   Reviewed expectations re: course of current medical issues. Questions answered. Outlined signs and symptoms indicating need for more acute intervention. Patient verbalized understanding. After Visit Summary given.     Rennis Harding, PA-C 08/26/21 1440

## 2021-09-01 ENCOUNTER — Ambulatory Visit
Admission: EM | Admit: 2021-09-01 | Discharge: 2021-09-01 | Disposition: A | Discharge status: Home / Self Care | Payer: Medicare Other | Attending: Emergency Medicine | Admitting: Emergency Medicine

## 2021-09-01 ENCOUNTER — Encounter: Payer: Self-pay | Admitting: Emergency Medicine

## 2021-09-01 DIAGNOSIS — M109 Gout, unspecified: Secondary | ICD-10-CM

## 2021-09-01 MED ORDER — METHYLPREDNISOLONE SODIUM SUCC 125 MG IJ SOLR
125.0000 mg | Freq: Once | INTRAMUSCULAR | Status: AC
Start: 2021-09-01 — End: 2021-09-01
  Administered 2021-09-01: 125 mg via INTRAMUSCULAR

## 2021-09-01 MED ORDER — KETOROLAC TROMETHAMINE 30 MG/ML IJ SOLN
30.0000 mg | Freq: Once | INTRAMUSCULAR | Status: AC
  Administered 2021-09-01: 30 mg via INTRAMUSCULAR

## 2021-09-01 NOTE — ED Provider Notes (Signed)
[facility]   [id number] 09/01/21 Arrival Time: 7253  CC: RT knee pain  SUBJECTIVE: History from: patient. Cesar Harris is a 70 y.o. male complains of persistent RT knee gout flare up x 1-2 weeks  Denies a injury.  Localizes the pain to the front of knee.  Describes the pain as constant and achy in character.  Has tried OTC medications without relief.  Symptoms are made worse to the touch.  Reports similar symptoms in the past.  Denies fever, chills, ecchymosis, weakness, numbness and tingling.  ROS: As per HPI.  All other pertinent ROS negative.     Past Medical History:  Diagnosis Date   Barrett esophagus    COVID    Gout    History of kidney stones    Hypertension    Past Surgical History:  Procedure Laterality Date   BACK SURGERY  1998   l4 anf l5 herniated disc   BIOPSY  08/08/2021   Procedure: BIOPSY;  Surgeon: Lanelle Bal, DO;  [facility];  Service: Endoscopy;;   CATARACT EXTRACTION W/PHACO  04/03/2012   Procedure: CATARACT EXTRACTION PHACO AND INTRAOCULAR LENS PLACEMENT (IOC);  Surgeon: Gemma Payor, MD;  [facility]: [facility];  Service: Ophthalmology;  Laterality: Left;  CDE: 8.94   CATARACT EXTRACTION W/PHACO Right 11/14/2015   Procedure: CATARACT EXTRACTION PHACO AND INTRAOCULAR LENS PLACEMENT RIGHT EYE;  Surgeon: Gemma Payor, MD;  [facility]: [facility];  Service: Ophthalmology;  Laterality: Right;  CDE:8.19   COLONOSCOPY N/A 04/19/2016   Procedure: COLONOSCOPY;  Surgeon: Corbin Ade, MD;  [facility];  Service: Endoscopy;  Laterality: N/A;  200 - moved to 1:15 - office to notify   COLONOSCOPY WITH PROPOFOL N/A 08/08/2021   Procedure: COLONOSCOPY WITH PROPOFOL;  Surgeon: Lanelle Bal, DO;  [facility];  Service: Endoscopy;  Laterality: N/A;  7:30am   ESOPHAGOGASTRODUODENOSCOPY N/A 04/19/2016   Procedure: ESOPHAGOGASTRODUODENOSCOPY (EGD);  Surgeon: Corbin Ade, MD;  [facility];  Service: Endoscopy;  Laterality:  N/A;   ESOPHAGOGASTRODUODENOSCOPY (EGD) WITH PROPOFOL N/A 08/08/2021   Procedure: ESOPHAGOGASTRODUODENOSCOPY (EGD) WITH PROPOFOL;  Surgeon: Lanelle Bal, DO;  [facility];  Service: Endoscopy;  Laterality: N/A;   HERNIA REPAIR     left and right inguinal- Royston Bake   MALONEY DILATION N/A 04/19/2016   Procedure: Elease Hashimoto DILATION;  Surgeon: Corbin Ade, MD;  [facility];  Service: Endoscopy;  Laterality: N/A;   No Known Allergies No current facility-administered medications on file prior to encounter.   Current Outpatient Medications on File Prior to Encounter  Medication Sig Dispense Refill   amLODipine-benazepril (LOTREL) 5-10 MG per capsule Take 1 capsule by mouth daily.     colchicine 0.6 MG tablet Take 0.6 mg by mouth daily as needed (gout).     pantoprazole (PROTONIX) 40 MG tablet TAKE 1 TABLET BY MOUTH EVERY DAY 90 tablet 3   polyethylene glycol-electrolytes (TRILYTE) 420 g solution Take 4,000 mLs by mouth as directed. 4000 mL 0   predniSONE (DELTASONE) 20 MG tablet Take 20 mg by mouth daily as needed (Gout).     SAW PALMETTO, SERENOA REPENS, PO Take 900 mg by mouth 2 (two) times daily.     silodosin (RAPAFLO) 8 MG CAPS capsule Take 1 capsule (8 mg total) by mouth at bedtime. 90 capsule 3   Social History   Socioeconomic History   Marital status: Married    Spouse name: Not on file  Number of children: Not on file   Years of education: Not on file   Highest education level: Not on file  Occupational History   Not on file  Tobacco Use   Smoking status: Never   Smokeless tobacco: Never  Vaping Use   Vaping Use: Never used  Substance and Sexual Activity   Alcohol use: No    Alcohol/week: 0.0 standard drinks   Drug use: No   Sexual activity: Yes    Birth control/protection: None  Other Topics Concern   Not on file  Social History Narrative   Not on file   Social Determinants of Health   Financial Resource Strain: Not on file  Food  Insecurity: Not on file  Transportation Needs: Not on file  Physical Activity: Not on file  Stress: Not on file  Social Connections: Not on file  Intimate Partner Violence: Not on file   Family History  Problem Relation Age of Onset   Anesthesia problems Neg Hx    Hypotension Neg Hx    Malignant hyperthermia Neg Hx    Pseudochol deficiency Neg Hx    Colon cancer Neg Hx     OBJECTIVE:  Vitals:   09/01/21 0831  BP: (!) 151/82  Pulse: 69  Resp: 19  Temp: 97.7 F (36.5 C)  TempSrc: Temporal  SpO2: 95%    General appearance: ALERT; in no acute distress.  Head: NCAT Lungs: Normal respiratory effort Musculoskeletal: RT knee Inspection: some warmth to the touch and redness Palpation: TTP over inferior medial aspect of RT knee ROM: FROM active and passive Skin: warm and dry Neurologic: Ambulates without difficulty Psychological: alert and cooperative; normal mood and affect  ASSESSMENT & PLAN:  1. Acute gout of right knee, unspecified cause     Meds ordered this encounter  Medications   ketorolac (TORADOL) 30 MG/ML injection 30 mg   methylPREDNISolone sodium succinate (SOLU-MEDROL) 125 mg/2 mL injection 125 mg    Continue conservative management of rest, ice, and elevation Steroid and toradol shots given Follow up with PCP if symptoms persist Return or go to the ER if you have any new or worsening symptoms (fever, chills, chest pain, redness, swelling, etc...)    Reviewed expectations re: course of current medical issues. Questions answered. Outlined signs and symptoms indicating need for more acute intervention. Patient verbalized understanding. After Visit Summary given.     Rennis Harding, PA-C 09/01/21 [phone]

## 2021-09-01 NOTE — ED Triage Notes (Signed)
gout flare up in his right knee. Seen here on Saturday and given 2 shots with no relief.

## 2021-09-01 NOTE — Discharge Instructions (Signed)
Continue conservative management of rest, ice, and elevation Steroid and toradol shots given Follow up with PCP if symptoms persist Return or go to the ER if you have any new or worsening symptoms (fever, chills, chest pain, redness, swelling, etc...)

## 2021-09-11 ENCOUNTER — Other Ambulatory Visit (HOSPITAL_COMMUNITY): Payer: Self-pay | Admitting: Family Medicine

## 2021-09-11 ENCOUNTER — Ambulatory Visit (HOSPITAL_COMMUNITY)
Admission: RE | Admit: 2021-09-11 | Discharge: 2021-09-11 | Disposition: A | Discharge status: Home / Self Care | Payer: Medicare Other | Source: Ambulatory Visit | Attending: Family Medicine | Admitting: Family Medicine

## 2021-09-11 ENCOUNTER — Other Ambulatory Visit: Payer: Self-pay | Admitting: Family Medicine

## 2021-09-11 ENCOUNTER — Other Ambulatory Visit: Payer: Self-pay

## 2021-09-11 DIAGNOSIS — M25561 Pain in right knee: Secondary | ICD-10-CM | POA: Insufficient documentation

## 2021-09-27 ENCOUNTER — Ambulatory Visit (HOSPITAL_COMMUNITY)
Admission: RE | Admit: 2021-09-27 | Discharge: 2021-09-27 | Disposition: A | Discharge status: Home / Self Care | Payer: Medicare Other | Source: Ambulatory Visit | Attending: Family Medicine | Admitting: Family Medicine

## 2021-09-27 ENCOUNTER — Other Ambulatory Visit: Payer: Self-pay

## 2021-09-27 DIAGNOSIS — M25561 Pain in right knee: Secondary | ICD-10-CM | POA: Insufficient documentation

## 2021-11-08 ENCOUNTER — Other Ambulatory Visit: Payer: Self-pay

## 2021-11-08 ENCOUNTER — Ambulatory Visit: Payer: Medicare Other | Admitting: Urology

## 2021-11-08 ENCOUNTER — Encounter: Payer: Self-pay | Admitting: Urology

## 2021-11-08 VITALS — BP 173/92 | HR 65 | Temp 98.1°F

## 2021-11-08 DIAGNOSIS — R351 Nocturia: Secondary | ICD-10-CM | POA: Diagnosis not present

## 2021-11-08 DIAGNOSIS — R972 Elevated prostate specific antigen [PSA]: Secondary | ICD-10-CM | POA: Diagnosis not present

## 2021-11-08 DIAGNOSIS — N401 Enlarged prostate with lower urinary tract symptoms: Secondary | ICD-10-CM

## 2021-11-08 LAB — MICROSCOPIC EXAMINATION
RBC, Urine: NONE SEEN /hpf (ref 0–2)
Renal Epithel, UA: NONE SEEN /hpf

## 2021-11-08 LAB — URINALYSIS, ROUTINE W REFLEX MICROSCOPIC
Bilirubin, UA: NEGATIVE
Glucose, UA: NEGATIVE
Ketones, UA: NEGATIVE
Leukocytes,UA: NEGATIVE
Nitrite, UA: NEGATIVE
RBC, UA: NEGATIVE
Specific Gravity, UA: 1.025 (ref 1.005–1.030)
Urobilinogen, Ur: 0.2 mg/dL (ref 0.2–1.0)
pH, UA: 6 (ref 5.0–7.5)

## 2021-11-08 MED ORDER — SILODOSIN 8 MG PO CAPS: 8.0000 mg | ORAL_CAPSULE | Freq: Every day | ORAL | 3 refills | Status: DC

## 2021-11-08 MED ORDER — MIRABEGRON ER 25 MG PO TB24: 25.0000 mg | ORAL_TABLET | Freq: Every day | ORAL | 11 refills | Status: DC

## 2021-11-08 NOTE — Progress Notes (Signed)

## 2021-11-08 NOTE — Progress Notes (Signed)
11/08/2021 9:51 AM   Cesar Harris 09-18-1951 [id number]  Referring provider: Elfredia Nevins, MD [address] [place],  [place] [address]  Followup BPH with nocturia and elevated PSA   HPI: [name] is a 70yo here for followup for elevated PSA and BPH. He is currently on rapaflo 8mg  daily and mirabegron 25mg  daily. IPSS 9 QOL 1. He is very happy with his urination. Urine stream strong, no urinary hesitancy, no straining to urinate. Nocturia 2x. No recent PSA. No other complaints today   PMH: Past Medical History:  Diagnosis Date   Barrett esophagus    COVID    Gout    History of kidney stones    Hypertension     Surgical History: Past Surgical History:  Procedure Laterality Date   BACK SURGERY  1998   l4 anf l5 herniated disc   BIOPSY  08/08/2021   Procedure: BIOPSY;  Surgeon: 04-15-1976, DO;  [facility];  Service: Endoscopy;;   CATARACT EXTRACTION W/PHACO  04/03/2012   Procedure: CATARACT EXTRACTION PHACO AND INTRAOCULAR LENS PLACEMENT (IOC);  Surgeon: Lanelle Bal, MD;  [facility]: [facility];  Service: Ophthalmology;  Laterality: Left;  CDE: 8.94   CATARACT EXTRACTION W/PHACO Right 11/14/2015   Procedure: CATARACT EXTRACTION PHACO AND INTRAOCULAR LENS PLACEMENT RIGHT EYE;  Surgeon: Gemma Payor, MD;  [facility]: [facility];  Service: Ophthalmology;  Laterality: Right;  CDE:8.19   COLONOSCOPY N/A 04/19/2016   Procedure: COLONOSCOPY;  Surgeon: Gemma Payor, MD;  [facility];  Service: Endoscopy;  Laterality: N/A;  200 - moved to 1:15 - office to notify   COLONOSCOPY WITH PROPOFOL N/A 08/08/2021   Procedure: COLONOSCOPY WITH PROPOFOL;  Surgeon: Corbin Ade, DO;  [facility];  Service: Endoscopy;  Laterality: N/A;  7:30am   ESOPHAGOGASTRODUODENOSCOPY N/A 04/19/2016   Procedure: ESOPHAGOGASTRODUODENOSCOPY (EGD);  Surgeon: Lanelle Bal, MD;  [facility];  Service: Endoscopy;  Laterality: N/A;   ESOPHAGOGASTRODUODENOSCOPY  (EGD) WITH PROPOFOL N/A 08/08/2021   Procedure: ESOPHAGOGASTRODUODENOSCOPY (EGD) WITH PROPOFOL;  Surgeon: Corbin Ade, DO;  [facility];  Service: Endoscopy;  Laterality: N/A;   HERNIA REPAIR     left and right inguinal- 10/08/2021   MALONEY DILATION N/A 04/19/2016   Procedure: Royston Bake DILATION;  Surgeon: 04/21/2016, MD;  [facility];  Service: Endoscopy;  Laterality: N/A;    Home Medications:  Allergies as of 11/08/2021   No Known Allergies      Medication List        Accurate as of November 08, 2021  9:51 AM. If you have any questions, ask your nurse or doctor.          amLODipine-benazepril 5-10 MG capsule Commonly known as: LOTREL Take 1 capsule by mouth daily.   colchicine 0.6 MG tablet Take 0.6 mg by mouth daily as needed (gout).   pantoprazole 40 MG tablet Commonly known as: PROTONIX TAKE 1 TABLET BY MOUTH EVERY DAY   polyethylene glycol-electrolytes 420 g solution Commonly known as: TriLyte Take 4,000 mLs by mouth as directed.   predniSONE 20 MG tablet Commonly known as: DELTASONE Take 20 mg by mouth daily as needed (Gout).   SAW PALMETTO (SERENOA REPENS) PO Take 900 mg by mouth 2 (two) times daily.   silodosin 8 MG Caps capsule Commonly known as: RAPAFLO Take 1 capsule (8 mg total) by mouth at bedtime.        Allergies: No Known Allergies  Family History: Family History  Problem Relation Age of Onset   Anesthesia problems Neg Hx    Hypotension Neg Hx    Malignant hyperthermia Neg Hx    Pseudochol deficiency Neg Hx    Colon cancer Neg Hx     Social History:  reports that he has never smoked. He has never used smokeless tobacco. He reports that he does not drink alcohol and does not use drugs.  ROS: All other review of systems were reviewed and are negative except what is noted above in HPI  Physical Exam: BP (!) 173/92   Pulse 65   Temp 98.1 F (36.7 C)   Constitutional:  Alert and oriented, No acute  distress. HEENT: [place] AT, moist mucus membranes.  Trachea midline, no masses. Cardiovascular: No clubbing, cyanosis, or edema. Respiratory: Normal respiratory effort, no increased work of breathing. GI: Abdomen is soft, nontender, nondistended, no abdominal masses GU: No CVA tenderness. Circumcised phallus. No masses/lesions on penis, testis, scrotum. Prostate 40g smooth no nodules no induration.  Lymph: No cervical or inguinal lymphadenopathy. Skin: No rashes, bruises or suspicious lesions. Neurologic: Grossly intact, no focal deficits, moving all 4 extremities. Psychiatric: Normal mood and affect.  Laboratory Data: Lab Results  Component Value Date   WBC 7.5 11/08/2015   HGB 15.7 11/08/2015   HCT 46.3 11/08/2015   MCV 85.3 11/08/2015   PLT 180 11/08/2015    Lab Results  Component Value Date   CREATININE 1.32 (H) 11/08/2015    No results found for: PSA  No results found for: TESTOSTERONE  No results found for: HGBA1C  Urinalysis    Component Value Date/Time   APPEARANCEUR Clear 11/07/2020 0918   GLUCOSEU Negative 11/07/2020 0918   BILIRUBINUR Negative 11/07/2020 0918   PROTEINUR 1+ (A) 11/07/2020 0918   NITRITE Negative 11/07/2020 0918   LEUKOCYTESUR Negative 11/07/2020 0918    Lab Results  Component Value Date   LABMICR See below: 11/07/2020   WBCUA None seen 11/07/2020   LABEPIT None seen 11/07/2020   MUCUS Present 11/07/2020   BACTERIA None seen 11/07/2020    Pertinent Imaging:  No results found for this or any previous visit.  No results found for this or any previous visit.  No results found for this or any previous visit.  No results found for this or any previous visit.  No results found for this or any previous visit.  No results found for this or any previous visit.  No results found for this or any previous visit.  No results found for this or any previous visit.   Assessment & Plan:    1. Nocturia Continue rapaflo 8mg  daily and  mirabegron 25mg  daily  2. Elevated PSA -PSA today, if the PSA remains stable I will see him back in 1 year with a PSA  3. Benign localized prostatic hyperplasia with lower urinary tract symptoms (LUTS) -Continue rapaflo 8mg  daily   No follow-ups on file.  , MD  [facility] [place]

## 2021-11-08 NOTE — Patient Instructions (Signed)

## 2021-11-09 LAB — PSA, TOTAL AND FREE
PSA, Free Pct: 26.7 %
PSA, Free: 1.76 ng/mL
Prostate Specific Ag, Serum: 6.6 ng/mL — ABNORMAL HIGH (ref 0.0–4.0)

## 2022-03-30 ENCOUNTER — Ambulatory Visit
Admission: EM | Admit: 2022-03-30 | Discharge: 2022-03-30 | Disposition: A | Discharge status: Home / Self Care | Payer: Medicare Other | Attending: Urgent Care | Admitting: Urgent Care

## 2022-03-30 ENCOUNTER — Encounter: Payer: Self-pay | Admitting: Emergency Medicine

## 2022-03-30 ENCOUNTER — Other Ambulatory Visit: Payer: Self-pay

## 2022-03-30 ENCOUNTER — Ambulatory Visit (INDEPENDENT_AMBULATORY_CARE_PROVIDER_SITE_OTHER): Payer: Medicare Other

## 2022-03-30 DIAGNOSIS — M1990 Unspecified osteoarthritis, unspecified site: Secondary | ICD-10-CM

## 2022-03-30 DIAGNOSIS — M25521 Pain in right elbow: Secondary | ICD-10-CM

## 2022-03-30 DIAGNOSIS — I10 Essential (primary) hypertension: Secondary | ICD-10-CM

## 2022-03-30 DIAGNOSIS — R030 Elevated blood-pressure reading, without diagnosis of hypertension: Secondary | ICD-10-CM

## 2022-03-30 DIAGNOSIS — Z8739 Personal history of other diseases of the musculoskeletal system and connective tissue: Secondary | ICD-10-CM

## 2022-03-30 MED ORDER — PREDNISONE 50 MG PO TABS: 50.0000 mg | ORAL_TABLET | Freq: Every day | ORAL | 0 refills | Status: DC

## 2022-03-30 NOTE — ED Provider Notes (Signed)
?[facility] ? ? ?MRN: [id number] DOB: 07/17/1951 ? ?Subjective:  ? ?Cesar Harris is a 71 y.o. male presenting for 5-day history of acute onset persistent right elbow pain, decreased range of motion. Has a history of gout in multiple joints.  Has leftover prednisone and colchicine from previous gout flares.  Has been doing 20 mg of prednisone for the past 3 days.  Has also done colchicine.  No fall, trauma. ? ?No current facility-administered medications for this encounter. ? ?Current Outpatient Medications:  ?  amLODipine-benazepril (LOTREL) 5-10 MG per capsule, Take 1 capsule by mouth daily., Disp: , Rfl:  ?  colchicine 0.6 MG tablet, Take 0.6 mg by mouth daily as needed (gout)., Disp: , Rfl:  ?  mirabegron ER (MYRBETRIQ) 25 MG TB24 tablet, Take 1 tablet (25 mg total) by mouth daily., Disp: 30 tablet, Rfl: 11 ?  pantoprazole (PROTONIX) 40 MG tablet, TAKE 1 TABLET BY MOUTH EVERY DAY, Disp: 90 tablet, Rfl: 3 ?  polyethylene glycol-electrolytes (TRILYTE) 420 g solution, Take 4,000 mLs by mouth as directed., Disp: 4000 mL, Rfl: 0 ?  predniSONE (DELTASONE) 20 MG tablet, Take 20 mg by mouth daily as needed (Gout)., Disp: , Rfl:  ?  SAW PALMETTO, SERENOA REPENS, PO, Take 900 mg by mouth 2 (two) times daily., Disp: , Rfl:  ?  silodosin (RAPAFLO) 8 MG CAPS capsule, Take 1 capsule (8 mg total) by mouth at bedtime., Disp: 90 capsule, Rfl: 3  ? ?No Known Allergies ? ?Past Medical History:  ?Diagnosis Date  ? Barrett esophagus   ? COVID   ? Gout   ? History of kidney stones   ? Hypertension   ?  ? ?Past Surgical History:  ?Procedure Laterality Date  ? BACK SURGERY  1998  ? l4 anf l5 herniated disc  ? BIOPSY  08/08/2021  ? Procedure: BIOPSY;  Surgeon: Lanelle Bal, DO;  [facility];  Service: Endoscopy;;  ? CATARACT EXTRACTION W/PHACO  04/03/2012  ? Procedure: CATARACT EXTRACTION PHACO AND INTRAOCULAR LENS PLACEMENT (IOC);  Surgeon: Gemma Payor, MD;  Location: [facility];  Service: Ophthalmology;   Laterality: Left;  CDE: 8.94  ? CATARACT EXTRACTION W/PHACO Right 11/14/2015  ? Procedure: CATARACT EXTRACTION PHACO AND INTRAOCULAR LENS PLACEMENT RIGHT EYE;  Surgeon: Gemma Payor, MD;  Location: [facility];  Service: Ophthalmology;  Laterality: Right;  CDE:8.19  ? COLONOSCOPY N/A 04/19/2016  ? Procedure: COLONOSCOPY;  Surgeon: Corbin Ade, MD;  [facility];  Service: Endoscopy;  Laterality: N/A;  200 - moved to 1:15 - office to notify  ? COLONOSCOPY WITH PROPOFOL N/A 08/08/2021  ? Procedure: COLONOSCOPY WITH PROPOFOL;  Surgeon: Lanelle Bal, DO;  [facility];  Service: Endoscopy;  Laterality: N/A;  7:30am  ? ESOPHAGOGASTRODUODENOSCOPY N/A 04/19/2016  ? Procedure: ESOPHAGOGASTRODUODENOSCOPY (EGD);  Surgeon: Corbin Ade, MD;  [facility];  Service: Endoscopy;  Laterality: N/A;  ? ESOPHAGOGASTRODUODENOSCOPY (EGD) WITH PROPOFOL N/A 08/08/2021  ? Procedure: ESOPHAGOGASTRODUODENOSCOPY (EGD) WITH PROPOFOL;  Surgeon: Lanelle Bal, DO;  [facility];  Service: Endoscopy;  Laterality: N/A;  ? HERNIA REPAIR    ? left and right inguinal- [facility]  ? MALONEY DILATION N/A 04/19/2016  ? Procedure: MALONEY DILATION;  Surgeon: Corbin Ade, MD;  [facility];  Service: Endoscopy;  Laterality: N/A;  ? ? ?Family History  ?Problem Relation Age of Onset  ? Anesthesia problems Neg Hx   ? Hypotension Neg Hx   ?  Malignant hyperthermia Neg Hx   ? Pseudochol deficiency Neg Hx   ? Colon cancer Neg Hx   ? ? ?Social History  ? ?Tobacco Use  ? Smoking status: Never  ? Smokeless tobacco: Never  ?Vaping Use  ? Vaping Use: Never used  ?Substance Use Topics  ? Alcohol use: No  ?  Alcohol/week: 0.0 standard drinks  ? Drug use: No  ? ? ?ROS ? ? ?Objective:  ? ?Vitals: ?BP (!) 159/109 (BP Location: Right Arm) Comment: repeat x2. pt reports hx of htn, took meds this am.  Pulse 77   Temp 97.8 ?F (36.6 ?C) (Oral)   Resp 18   Ht 6' (1.829 m)   Wt 220 lb (99.8 kg)   SpO2 95%   BMI  29.84 kg/m?  ? ?Physical Exam ?Constitutional:   ?   General: He is not in acute distress. ?   Appearance: Normal appearance. He is well-developed and normal weight. He is not ill-appearing, toxic-appearing or diaphoretic.  ?HENT:  ?   Head: Normocephalic and atraumatic.  ?   Right Ear: External ear normal.  ?   Left Ear: External ear normal.  ?   Nose: Nose normal.  ?   Mouth/Throat:  ?   Pharynx: Oropharynx is clear.  ?Eyes:  ?   General: No scleral icterus.    ?   Right eye: No discharge.     ?   Left eye: No discharge.  ?   Extraocular Movements: Extraocular movements intact.  ?Cardiovascular:  ?   Rate and Rhythm: Normal rate.  ?Pulmonary:  ?   Effort: Pulmonary effort is normal.  ?Musculoskeletal:  ?   Right elbow: Swelling present. No deformity, effusion or lacerations. Decreased range of motion. Tenderness (over area outlined with slight erythema and warmth) present. No radial head, medial epicondyle, lateral epicondyle or olecranon process tenderness.  ?     Arms: ? ?   Cervical back: Normal range of motion.  ?Neurological:  ?   Mental Status: He is alert and oriented to person, place, and time.  ?Psychiatric:     ?   Mood and Affect: Mood normal.     ?   Behavior: Behavior normal.     ?   Thought Content: Thought content normal.     ?   Judgment: Judgment normal.  ? ?DG Elbow Complete Right ? ?Result Date: 03/30/2022 ?CLINICAL DATA:  71 year old male right elbow pain and swelling for 4-5 days. History of gout. EXAM: RIGHT ELBOW - COMPLETE 3+ VIEW COMPARISON:  None. FINDINGS: Bulky degenerative spurring throughout the right elbow, although alignment is maintained. No severe joint space loss identified. Probable small joint effusion on the lateral view. No fracture identified. No cortical osteolysis identified. Regional soft tissue swelling. No soft tissue gas. IMPRESSION: Probable small joint effusion with bulky degenerative spurring throughout the right elbow and regional soft tissue swelling. No acute  osseous abnormality identified. Electronically Signed   By: Odessa Fleming M.D.   On: 03/30/2022 08:40   ? ?Assessment and Plan :  ? ?PDMP not reviewed this encounter. ? ?1. Arthritis   ?2. History of gout   ?3. Right elbow pain   ?4. Essential hypertension   ?5. Elevated blood pressure reading   ? ?Recommended holding the colchicine.  We will use a higher dose of prednisone to address suspected arthritis flare.  Recommended establishing care with Dr. Romeo Apple especially for follow-up regarding the possible small joint effusion. Counseled patient on potential for  adverse effects with medications prescribed/recommended today, ER and return-to-clinic precautions discussed, patient verbalized understanding. ? ?  ?Wallis BambergMani, Dodd Schmid, PA-C ?03/30/22 [id number] ? ?

## 2022-03-30 NOTE — ED Triage Notes (Signed)
Pt reports right elbow pain since Monday. Pt reports history of gout and states started taking prednisone and colchicine on Monday from previous flare-ups and reports little improvement in right elbow.  ?

## 2022-04-03 ENCOUNTER — Ambulatory Visit: Payer: Medicare Other | Admitting: Orthopaedic Surgery

## 2022-04-03 VITALS — BP 146/96 | HR 80 | Ht 72.0 in | Wt 222.0 lb

## 2022-04-03 DIAGNOSIS — M1A0210 Idiopathic chronic gout, right elbow, without tophus (tophi): Secondary | ICD-10-CM

## 2022-04-03 MED ORDER — ALLOPURINOL 300 MG PO TABS: 300.0000 mg | ORAL_TABLET | Freq: Every day | ORAL | 5 refills | Status: DC

## 2022-04-03 MED ORDER — COLCHICINE 0.6 MG PO TABS: ORAL_TABLET | ORAL | 3 refills | Status: DC

## 2022-04-03 MED ORDER — PREDNISONE 10 MG (21) PO TBPK: ORAL_TABLET | ORAL | 1 refills | Status: DC

## 2022-04-03 NOTE — Progress Notes (Signed)
? ?Subjective:  ? ? Patient ID: Cesar Harris, male    DOB: Oct 18, 1951, 71 y.o.   MRN: [id number] ? ?HPI ?My right elbow is hurting.  I have the gout. ? ?He has long history of gout affecting the right knee and right foot.  He has developed gout pain of the right elbow recently. He has been seen by Dr. Sherwood Gambler for this and given prednisone.  He then went to Urgent Care on 03-30-22 and was seen and had X-rays.  Prednisone was increased.  He had taken colchicine.  He is not taking any allopurinol. ? ?His parents had gout.  He has been carrying this diagnosis on himself for over ten years. ? ?His elbow is tender on the right around the olecranon area.  He has no trauma. ? ? ? ?Review of Systems  ?Constitutional:  Positive for activity change.  ?Musculoskeletal:  Positive for arthralgias, back pain and joint swelling.  ?All other systems reviewed and are negative. ?For Review of Systems, all other systems reviewed and are negative. ? ?The following is a summary of the past history medically, past history surgically, known current medicines, social history and family history.  This information is gathered electronically by the computer from prior information and documentation.  I review this each visit and have found including this information at this point in the chart is beneficial and informative.  ? ?Past Medical History:  ?Diagnosis Date  ? Barrett esophagus   ? COVID   ? Gout   ? History of kidney stones   ? Hypertension   ? ? ?Past Surgical History:  ?Procedure Laterality Date  ? BACK SURGERY  1998  ? l4 anf l5 herniated disc  ? BIOPSY  08/08/2021  ? Procedure: BIOPSY;  Surgeon: Lanelle Bal, DO;  [facility];  Service: Endoscopy;;  ? CATARACT EXTRACTION W/PHACO  04/03/2012  ? Procedure: CATARACT EXTRACTION PHACO AND INTRAOCULAR LENS PLACEMENT (IOC);  Surgeon: Gemma Payor, MD;  [facility];  Service: Ophthalmology;  Laterality: Left;  CDE: 8.94  ? CATARACT EXTRACTION W/PHACO Right 11/14/2015  ?  Procedure: CATARACT EXTRACTION PHACO AND INTRAOCULAR LENS PLACEMENT RIGHT EYE;  Surgeon: Gemma Payor, MD;  [facility]: [facility];  Service: Ophthalmology;  Laterality: Right;  CDE:8.19  ? COLONOSCOPY N/A 04/19/2016  ? Procedure: COLONOSCOPY;  Surgeon: Corbin Ade, MD;  [facility];  Service: Endoscopy;  Laterality: N/A;  200 - moved to 1:15 - office to notify  ? COLONOSCOPY WITH PROPOFOL N/A 08/08/2021  ? Procedure: COLONOSCOPY WITH PROPOFOL;  Surgeon: Lanelle Bal, DO;  [facility];  Service: Endoscopy;  Laterality: N/A;  7:30am  ? ESOPHAGOGASTRODUODENOSCOPY N/A 04/19/2016  ? Procedure: ESOPHAGOGASTRODUODENOSCOPY (EGD);  Surgeon: Corbin Ade, MD;  [facility];  Service: Endoscopy;  Laterality: N/A;  ? ESOPHAGOGASTRODUODENOSCOPY (EGD) WITH PROPOFOL N/A 08/08/2021  ? Procedure: ESOPHAGOGASTRODUODENOSCOPY (EGD) WITH PROPOFOL;  Surgeon: Lanelle Bal, DO;  [facility];  Service: Endoscopy;  Laterality: N/A;  ? HERNIA REPAIR    ? left and right inguinal- [facility] [facility]  ? MALONEY DILATION N/A 04/19/2016  ? Procedure: MALONEY DILATION;  Surgeon: Corbin Ade, MD;  [facility];  Service: Endoscopy;  Laterality: N/A;  ? ? ?Current Outpatient Medications on File Prior to Visit  ?Medication Sig Dispense Refill  ? amLODipine-benazepril (LOTREL) 5-10 MG per capsule Take 1 capsule by mouth daily.    ? mirabegron ER (MYRBETRIQ) 25 MG TB24 tablet Take  1 tablet (25 mg total) by mouth daily. 30 tablet 11  ? pantoprazole (PROTONIX) 40 MG tablet TAKE 1 TABLET BY MOUTH EVERY DAY 90 tablet 3  ? polyethylene glycol-electrolytes (TRILYTE) 420 g solution Take 4,000 mLs by mouth as directed. 4000 mL 0  ? SAW PALMETTO, SERENOA REPENS, PO Take 900 mg by mouth 2 (two) times daily.    ? silodosin (RAPAFLO) 8 MG CAPS capsule Take 1 capsule (8 mg total) by mouth at bedtime. 90 capsule 3  ? ?No current facility-administered medications on file prior to visit.  ? ? ?Social History   ? ?Socioeconomic History  ? Marital status: Married  ?  Spouse name: Not on file  ? Number of children: Not on file  ? Years of education: Not on file  ? Highest education level: Not on file  ?Occupational History  ? Not on file  ?Tobacco Use  ? Smoking status: Never  ? Smokeless tobacco: Never  ?Vaping Use  ? Vaping Use: Never used  ?Substance and Sexual Activity  ? Alcohol use: No  ?  Alcohol/week: 0.0 standard drinks  ? Drug use: No  ? Sexual activity: Yes  ?  Birth control/protection: None  ?Other Topics Concern  ? Not on file  ?Social History Narrative  ? Not on file  ? ?Social Determinants of Health  ? ?Financial Resource Strain: Not on file  ?Food Insecurity: Not on file  ?Transportation Needs: Not on file  ?Physical Activity: Not on file  ?Stress: Not on file  ?Social Connections: Not on file  ?Intimate Partner Violence: Not on file  ? ? ?Family History  ?Problem Relation Age of Onset  ? Anesthesia problems Neg Hx   ? Hypotension Neg Hx   ? Malignant hyperthermia Neg Hx   ? Pseudochol deficiency Neg Hx   ? Colon cancer Neg Hx   ? ? ?BP (!) 146/96   Pulse 80   Ht 6' (1.829 m)   Wt 222 lb (100.7 kg)   BMI 30.11 kg/m?  ? ?Body mass index is 30.11 kg/m?. ? ?   ?Objective:  ? Physical Exam ?Vitals and nursing note reviewed. Exam conducted with a chaperone present.  ?Constitutional:   ?   Appearance: He is well-developed.  ?HENT:  ?   Head: Normocephalic and atraumatic.  ?Eyes:  ?   Conjunctiva/sclera: Conjunctivae normal.  ?   Pupils: Pupils are equal, round, and reactive to light.  ?Cardiovascular:  ?   Rate and Rhythm: Normal rate and regular rhythm.  ?Pulmonary:  ?   Effort: Pulmonary effort is normal.  ?Abdominal:  ?   Palpations: Abdomen is soft.  ?Musculoskeletal:  ?     Arms: ? ?   Cervical back: Normal range of motion and neck supple.  ?Skin: ?   General: Skin is warm and dry.  ?Neurological:  ?   Mental Status: He is alert and oriented to person, place, and time.  ?   Cranial Nerves: No cranial  nerve deficit.  ?   Motor: No abnormal muscle tone.  ?   Coordination: Coordination normal.  ?   Deep Tendon Reflexes: Reflexes are normal and symmetric. Reflexes normal.  ?Psychiatric:     ?   Behavior: Behavior normal.     ?   Thought Content: Thought content normal.     ?   Judgment: Judgment normal.  ? ?I have independently reviewed and interpreted x-rays of this patient done at another site by another physician or qualified  health professional. ?I have reviewed the Urgent care notes as well. ? ? ? ?   ?Assessment & Plan:  ? ?Encounter Diagnosis  ?Name Primary?  ? 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 ?I have spent some time with him explaining what gout is, how he got it and how to treat. ? ?I will give allopurinol beginning today. ? ?I will give more colchicine for five more days. ? ?Return in one week. ? ?Get serum uric acid level. ? ?Call if any problem. ? ?Precautions discussed. ? ?Electronically Signed ?Darreld McleanWayne Sarrah Fiorenza, MD ?4/4/20234:36 PM ? ?

## 2022-04-04 LAB — URIC ACID: Uric Acid, Serum: 7.8 mg/dL (ref 4.0–8.0)

## 2022-04-13 ENCOUNTER — Ambulatory Visit
Admission: EM | Admit: 2022-04-13 | Discharge: 2022-04-13 | Disposition: A | Discharge status: Home / Self Care | Payer: Medicare Other | Attending: Urgent Care | Admitting: Urgent Care

## 2022-04-13 DIAGNOSIS — R252 Cramp and spasm: Secondary | ICD-10-CM | POA: Diagnosis not present

## 2022-04-13 MED ORDER — ACETAMINOPHEN 325 MG PO TABS: 650.0000 mg | ORAL_TABLET | Freq: Four times a day (QID) | ORAL | 0 refills | Status: AC | PRN

## 2022-04-13 MED ORDER — TIZANIDINE HCL 4 MG PO TABS: 4.0000 mg | ORAL_TABLET | Freq: Every day | ORAL | 0 refills | Status: AC

## 2022-04-13 NOTE — ED Provider Notes (Addendum)
?[facility] ? ? ?MRN: [id number] DOB: Nov 18, 1951 ? ?Subjective:  ? ?Cesar Harris is a 71 y.o. male presenting for 2-day history of persistent bilateral lower leg cramping now feeling right thigh cramping.  Denies swelling, bruising, redness, warmth, fevers.  Has a history of gout but this feels different.  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  No falls, trauma.  No history of blood clots, DVTs.  Does not hydrate well, drinks 1-2 bottles of water a day including this past week but has been working outdoors a lot.  He does do about 1 bottle of Gatorade as well.  He also drinks sweet tea. ? ?No current facility-administered medications for this encounter. ? ?Current Outpatient Medications:  ?  allopurinol (ZYLOPRIM) 300 MG tablet, Take 1 tablet (300 mg total) by mouth daily., Disp: 30 tablet, Rfl: 5 ?  amLODipine-benazepril (LOTREL) 5-10 MG per capsule, Take 1 capsule by mouth daily., Disp: , Rfl:  ?  colchicine 0.6 MG tablet, One by mouth three times a day for five days for gout pain., Disp: 15 tablet, Rfl: 3 ?  mirabegron ER (MYRBETRIQ) 25 MG TB24 tablet, Take 1 tablet (25 mg total) by mouth daily., Disp: 30 tablet, Rfl: 11 ?  pantoprazole (PROTONIX) 40 MG tablet, TAKE 1 TABLET BY MOUTH EVERY DAY, Disp: 90 tablet, Rfl: 3 ?  polyethylene glycol-electrolytes (TRILYTE) 420 g solution, Take 4,000 mLs by mouth as directed., Disp: 4000 mL, Rfl: 0 ?  SAW PALMETTO, SERENOA REPENS, PO, Take 900 mg by mouth 2 (two) times daily., Disp: , Rfl:  ?  silodosin (RAPAFLO) 8 MG CAPS capsule, Take 1 capsule (8 mg total) by mouth at bedtime., Disp: 90 capsule, Rfl: 3  ? ?No Known Allergies ? ?Past Medical History:  ?Diagnosis Date  ? Barrett esophagus   ? COVID   ? Gout   ? History of kidney stones   ? Hypertension   ?  ? ?Past Surgical History:  ?Procedure Laterality Date  ? BACK SURGERY  1998  ? l4 anf l5 herniated disc  ?  BIOPSY  08/08/2021  ? Procedure: BIOPSY;  Surgeon: Lanelle Bal, DO;  [facility];  Service: Endoscopy;;  ? CATARACT EXTRACTION W/PHACO  04/03/2012  ? Procedure: CATARACT EXTRACTION PHACO AND INTRAOCULAR LENS PLACEMENT (IOC);  Surgeon: Gemma Payor, MD;  [facility];  Service: Ophthalmology;  Laterality: Left;  CDE: 8.94  ? CATARACT EXTRACTION W/PHACO Right 11/14/2015  ? Procedure: CATARACT EXTRACTION PHACO AND INTRAOCULAR LENS PLACEMENT RIGHT EYE;  Surgeon: Gemma Payor, MD;  [facility];  Service: Ophthalmology;  Laterality: Right;  CDE:8.19  ? COLONOSCOPY N/A 04/19/2016  ? Procedure: COLONOSCOPY;  Surgeon: Corbin Ade, MD;  [facility];  Service: Endoscopy;  Laterality: N/A;  200 - moved to 1:15 - office to notify  ? COLONOSCOPY WITH PROPOFOL N/A 08/08/2021  ? Procedure: COLONOSCOPY WITH PROPOFOL;  Surgeon: Lanelle Bal, DO;  [facility];  Service: Endoscopy;  Laterality: N/A;  7:30am  ? ESOPHAGOGASTRODUODENOSCOPY N/A 04/19/2016  ? Procedure: ESOPHAGOGASTRODUODENOSCOPY (EGD);  Surgeon: Corbin Ade, MD;  [facility];  Service: Endoscopy;  Laterality: N/A;  ? ESOPHAGOGASTRODUODENOSCOPY (EGD) WITH PROPOFOL N/A 08/08/2021  ? Procedure: ESOPHAGOGASTRODUODENOSCOPY (EGD) WITH PROPOFOL;  Surgeon: Lanelle Bal, DO;  [facility];  Service: Endoscopy;  Laterality: N/A;  ? HERNIA REPAIR    ?  left and right inguinal- [facility] [facility]  ? MALONEY DILATION N/A 04/19/2016  ? Procedure: MALONEY DILATION;  Surgeon: Corbin Ade, MD;  [facility];  Service: Endoscopy;  Laterality: N/A;  ? ? ?Family History  ?Problem Relation Age of Onset  ? Anesthesia problems Neg Hx   ? Hypotension Neg Hx   ? Malignant hyperthermia Neg Hx   ? Pseudochol deficiency Neg Hx   ? Colon cancer Neg Hx   ? ? ?Social History  ? ?Tobacco Use  ? Smoking status: Never  ? Smokeless tobacco: Never  ?Vaping Use  ? Vaping Use: Never used  ?Substance Use Topics  ? Alcohol use: No   ?  Alcohol/week: 0.0 standard drinks  ? Drug use: No  ? ? ?ROS ? ? ?Objective:  ? ?Vitals: ?BP 122/70   Pulse 77   Temp 98.3 ?F (36.8 ?C)   Resp 18   SpO2 95%  ? ?Physical Exam ?Constitutional:   ?   General: He is not in acute distress. ?   Appearance: Normal appearance. He is well-developed and normal weight. He is not ill-appearing, toxic-appearing or diaphoretic.  ?HENT:  ?   Head: Normocephalic and atraumatic.  ?   Right Ear: External ear normal.  ?   Left Ear: External ear normal.  ?   Nose: Nose normal.  ?   Mouth/Throat:  ?   Pharynx: Oropharynx is clear.  ?Eyes:  ?   General: No scleral icterus.    ?   Right eye: No discharge.     ?   Left eye: No discharge.  ?   Extraocular Movements: Extraocular movements intact.  ?Cardiovascular:  ?   Rate and Rhythm: Normal rate.  ?   Comments: Multiple dilated and tortuous veins of both lower extremities up to the knee. ?Pulmonary:  ?   Effort: Pulmonary effort is normal.  ?Musculoskeletal:     ?   General: Tenderness (muscular worse over right thigh) present. No swelling.  ?   Cervical back: Normal range of motion.  ?   Right lower leg: No edema.  ?   Left lower leg: No edema.  ?   Comments: Negative Homans' sign.  No warmth, swelling, erythema.  Full range of motion throughout.  ?Skin: ?   General: Skin is warm and dry.  ?Neurological:  ?   Mental Status: He is alert and oriented to person, place, and time.  ?Psychiatric:     ?   Mood and Affect: Mood normal.     ?   Behavior: Behavior normal.     ?   Thought Content: Thought content normal.     ?   Judgment: Judgment normal.  ? ? ?Assessment and Plan :  ? ?PDMP not reviewed this encounter. ? ?1. Muscle cramps   ?2. Leg cramps   ?3. Thigh cramp   ? ?Suspect muscle cramps likely related to dehydration and electrolyte imbalance.  Comprehensive metabolic panel and CK level pending.  Has possible symptomatic varicose veins.  Recommended much better hydration, conservative management with Tylenol and tizanidine.   Advised rest especially the rest of this weekend. Counseled patient on potential for adverse effects with medications prescribed/recommended today, ER and return-to-clinic precautions discussed, patient verbalized understanding. ?  ?Wallis Bamberg, PA-C ?04/13/22 1740 ? ?

## 2022-04-13 NOTE — ED Triage Notes (Signed)
Pt presents with c/o bilateral leg cramps for past couple of days. Pain is intermittent throughout the day  ?

## 2022-04-14 LAB — COMPREHENSIVE METABOLIC PANEL
ALT: 54 IU/L — ABNORMAL HIGH (ref 0–44)
AST: 28 IU/L (ref 0–40)
Albumin/Globulin Ratio: 1.6 (ref 1.2–2.2)
Albumin: 3.9 g/dL (ref 3.7–4.7)
Alkaline Phosphatase: 78 IU/L (ref 44–121)
BUN/Creatinine Ratio: 13 (ref 10–24)
BUN: 18 mg/dL (ref 8–27)
Bilirubin Total: 0.3 mg/dL (ref 0.0–1.2)
CO2: 20 mmol/L (ref 20–29)
Calcium: 9.3 mg/dL (ref 8.6–10.2)
Chloride: 102 mmol/L (ref 96–106)
Creatinine, Ser: 1.39 mg/dL — ABNORMAL HIGH (ref 0.76–1.27)
Globulin, Total: 2.4 g/dL (ref 1.5–4.5)
Glucose: 82 mg/dL (ref 70–99)
Potassium: 4.3 mmol/L (ref 3.5–5.2)
Sodium: 141 mmol/L (ref 134–144)
Total Protein: 6.3 g/dL (ref 6.0–8.5)
eGFR: 54 mL/min/{1.73_m2} — ABNORMAL LOW (ref >59)

## 2022-04-14 LAB — CK: Total CK: 119 U/L (ref 41–331)

## 2022-04-17 ENCOUNTER — Encounter: Payer: Self-pay | Admitting: Orthopaedic Surgery

## 2022-04-17 ENCOUNTER — Ambulatory Visit: Payer: Medicare Other | Admitting: Orthopaedic Surgery

## 2022-04-17 DIAGNOSIS — M1A0210 Idiopathic chronic gout, right elbow, without tophus (tophi): Secondary | ICD-10-CM | POA: Diagnosis not present

## 2022-04-17 NOTE — Progress Notes (Signed)
My gout is better. ? ?His serum uric acid was 7.8, high normal for lab (max 8.0).  I told him of his results.  He is much better after being on the allopurinol. ? ?He had severe cramps and went to Urgent Care Friday.  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  He is not drinking much water during the day and they told him to aggressively increase his water intake on those days.  He will. ? ?He has no joint pain today.  His right foot and knee are negative. ? ?Encounter Diagnosis  ?Name Primary?  ? 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 ?I will see prn. ? ?Take the allopurinol daily. ? ?Keep up with water intake on hot days mowing yards. ? ?Call if any problem. ? ?Precautions discussed. ? ?Electronically Signed ?Darreld Mclean, MD ?4/18/20233:02 PM ? ?

## 2022-05-20 ENCOUNTER — Ambulatory Visit
Admission: EM | Admit: 2022-05-20 | Discharge: 2022-05-20 | Disposition: A | Discharge status: Home / Self Care | Payer: Medicare Other | Attending: Family Medicine | Admitting: Family Medicine

## 2022-05-20 DIAGNOSIS — M109 Gout, unspecified: Secondary | ICD-10-CM | POA: Diagnosis not present

## 2022-05-20 MED ORDER — DEXAMETHASONE SODIUM PHOSPHATE 10 MG/ML IJ SOLN
10.0000 mg | Freq: Once | INTRAMUSCULAR | Status: AC
  Administered 2022-05-20: 10 mg via INTRAMUSCULAR

## 2022-05-20 MED ORDER — KETOROLAC TROMETHAMINE 30 MG/ML IJ SOLN
30.0000 mg | Freq: Once | INTRAMUSCULAR | Status: AC
Start: 2022-05-20 — End: 2022-05-20
  Administered 2022-05-20: 30 mg via INTRAMUSCULAR

## 2022-05-20 MED ORDER — COLCHICINE 0.6 MG PO TABS: ORAL_TABLET | ORAL | 0 refills | Status: DC

## 2022-05-20 NOTE — ED Triage Notes (Signed)
Pt states he has gout in his right hand.  Pt states he felt it swelling on Wednesday  Pt states he is already on Allopurinol and Colchicine for the gout but it is not helping

## 2022-05-20 NOTE — ED Provider Notes (Signed)
[facility]    CSN: [id number] Arrival date & time: 05/20/22  0809      History   Chief Complaint Chief Complaint  Patient presents with   Hand Pain    Right hand pain    HPI Cesar Harris is a 71 y.o. male.   Presenting today with 5-day history of right hand swelling, redness, pain with no known injury.  States he has a long history of gout, takes allopurinol daily and colchicine for flares but that is not helping.  Tends to have the best benefit with a shot of Decadron and Toradol when flares happen.  Denies fever, chills, numbness, tingling, loss of range of motion.   Past Medical History:  Diagnosis Date   Barrett esophagus    COVID    Gout    History of kidney stones    Hypertension     Patient Active Problem List   Diagnosis Date Noted   Elevated PSA 11/07/2020   Benign localized prostatic hyperplasia with lower urinary tract symptoms (LUTS) 11/07/2020   Nocturia 11/07/2020   Barrett's esophagus 06/11/2016   History of colonic polyps    Diverticulosis of colon without hemorrhage    Mucosal abnormality of esophagus    History of adenomatous polyp of colon 04/09/2016   Dysphagia 04/09/2016    Past Surgical History:  Procedure Laterality Date   BACK SURGERY  1998   l4 anf l5 herniated disc   BIOPSY  08/08/2021   Procedure: BIOPSY;  Surgeon: Lanelle Bal, DO;  Location: [facility];  Service: Endoscopy;;   CATARACT EXTRACTION W/PHACO  04/03/2012   Procedure: CATARACT EXTRACTION PHACO AND INTRAOCULAR LENS PLACEMENT (IOC);  Surgeon: Gemma Payor, MD;  Location: [facility];  Service: Ophthalmology;  Laterality: Left;  CDE: 8.94   CATARACT EXTRACTION W/PHACO Right 11/14/2015   Procedure: CATARACT EXTRACTION PHACO AND INTRAOCULAR LENS PLACEMENT RIGHT EYE;  Surgeon: Gemma Payor, MD;  Location: [facility];  Service: Ophthalmology;  Laterality: Right;  CDE:8.19   COLONOSCOPY N/A 04/19/2016   Procedure: COLONOSCOPY;  Surgeon: Corbin Ade, MD;  [facility];  Service: Endoscopy;  Laterality: N/A;  200 - moved to 1:15 - office to notify   COLONOSCOPY WITH PROPOFOL N/A 08/08/2021   Procedure: COLONOSCOPY WITH PROPOFOL;  Surgeon: Lanelle Bal, DO;  [facility]: [facility];  Service: Endoscopy;  Laterality: N/A;  7:30am   ESOPHAGOGASTRODUODENOSCOPY N/A 04/19/2016   Procedure: ESOPHAGOGASTRODUODENOSCOPY (EGD);  Surgeon: Corbin Ade, MD;  [facility]: [facility];  Service: Endoscopy;  Laterality: N/A;   ESOPHAGOGASTRODUODENOSCOPY (EGD) WITH PROPOFOL N/A 08/08/2021   Procedure: ESOPHAGOGASTRODUODENOSCOPY (EGD) WITH PROPOFOL;  Surgeon: Lanelle Bal, DO;  [facility];  Service: Endoscopy;  Laterality: N/A;   HERNIA REPAIR     left and right inguinal- Royston Bake   MALONEY DILATION N/A 04/19/2016   Procedure: Elease Hashimoto DILATION;  Surgeon: Corbin Ade, MD;  [facility]: [facility];  Service: Endoscopy;  Laterality: N/A;       Home Medications    Prior to Admission medications   Medication Sig Start Date End Date Taking? Authorizing Provider  acetaminophen (TYLENOL) 325 MG tablet Take 2 tablets (650 mg total) by mouth every 6 (six) hours as needed for moderate pain. 04/13/22   Wallis Bamberg, PA-C  allopurinol (ZYLOPRIM) 300 MG tablet Take 1 tablet (300 mg total) by mouth daily. 04/03/22   Darreld Mclean, MD  amLODipine-benazepril (LOTREL) 5-10 MG per capsule Take 1 capsule by  mouth daily.    [provider]  colchicine 0.6 MG tablet One by mouth three times a day for five days for gout pain. 05/20/22   [name], PA-C  mirabegron ER (MYRBETRIQ) 25 MG TB24 tablet Take 1 tablet (25 mg total) by mouth daily. 11/08/21   McKenzie, Mardene Celeste, MD  pantoprazole (PROTONIX) 40 MG tablet TAKE 1 TABLET BY MOUTH EVERY DAY 09/29/18   Gelene Mink, NP  polyethylene glycol-electrolytes (TRILYTE) 420 g solution Take 4,000 mLs by mouth as directed. 07/06/21   Lanelle Bal, DO  SAW PALMETTO, SERENOA REPENS, PO Take 900 mg by  mouth 2 (two) times daily.    [provider]  silodosin (RAPAFLO) 8 MG CAPS capsule Take 1 capsule (8 mg total) by mouth at bedtime. 11/08/21   McKenzie, Mardene Celeste, MD  tiZANidine (ZANAFLEX) 4 MG tablet Take 1 tablet (4 mg total) by mouth at bedtime. 04/13/22   Wallis Bamberg, PA-C    Family History Family History  Problem Relation Age of Onset   Anesthesia problems Neg Hx    Hypotension Neg Hx    Malignant hyperthermia Neg Hx    Pseudochol deficiency Neg Hx    Colon cancer Neg Hx     Social History Social History   Tobacco Use   Smoking status: Never   Smokeless tobacco: Never  Vaping Use   Vaping Use: Never used  Substance Use Topics   Alcohol use: No    Alcohol/week: 0.0 standard drinks   Drug use: No     Allergies   Patient has no known allergies.   Review of Systems Review of Systems Per HPI  Physical Exam Triage Vital Signs ED Triage Vitals  Enc Vitals Group     BP 05/20/22 0823 (!) 156/96     Pulse Rate 05/20/22 0823 78     Resp 05/20/22 0823 18     Temp 05/20/22 0823 98 F (36.7 C)     Temp Source 05/20/22 0823 Oral     SpO2 05/20/22 0823 94 %     Weight --      Height --      Head Circumference --      Peak Flow --      Pain Score 05/20/22 0820 10     Pain Loc --      Pain Edu? --      Excl. in GC? --    No data found.  Updated Vital Signs BP (!) 156/96 (BP Location: Right Arm)   Pulse 78   Temp 98 F (36.7 C) (Oral)   Resp 18   SpO2 94%   Visual Acuity Right Eye Distance:   Left Eye Distance:   Bilateral Distance:    Right Eye Near:   Left Eye Near:    Bilateral Near:     Physical Exam Vitals and nursing note reviewed.  Constitutional:      Appearance: Normal appearance.  HENT:     Head: Atraumatic.  Eyes:     Extraocular Movements: Extraocular movements intact.     Conjunctiva/sclera: Conjunctivae normal.  Cardiovascular:     Rate and Rhythm: Normal rate and regular rhythm.  Pulmonary:     Effort: Pulmonary  effort is normal.     Breath sounds: Normal breath sounds.  Musculoskeletal:        General: Swelling and tenderness present. No deformity or signs of injury. Normal range of motion.     Cervical back: Normal range  of motion and neck supple.     Comments: Localized edema, erythema, warmth, tenderness dorsal right hand  Skin:    General: Skin is warm and dry.     Findings: Erythema present. No bruising.  Neurological:     General: No focal deficit present.     Mental Status: He is oriented to person, place, and time.     Comments: Right hand neurovascularly intact  Psychiatric:        Mood and Affect: Mood normal.        Thought Content: Thought content normal.        Judgment: Judgment normal.     UC Treatments / Results  Labs (all labs ordered are listed, but only abnormal results are displayed) Labs Reviewed - No data to display  EKG   Radiology No results found.  Procedures Procedures (including critical care time)  Medications Ordered in UC Medications  ketorolac (TORADOL) 30 MG/ML injection 30 mg (30 mg Intramuscular Given 05/20/22 0839)  dexamethasone (DECADRON) injection 10 mg (10 mg Intramuscular Given 05/20/22 0840)    Initial Impression / Assessment and Plan / UC Course  I have reviewed the triage vital signs and the nursing notes.  Pertinent labs & imaging results that were available during my care of the patient were reviewed by me and considered in my medical decision making (see chart for details).     Consistent with gout flare, will refill colchicine as he states he is almost out and give Toradol and Decadron IM today.  Follow-up with PCP for recheck.  Final Clinical Impressions(s) / UC Diagnoses   Final diagnoses:  Acute gout of right hand, unspecified cause   Discharge Instructions   None    ED Prescriptions     Medication Sig Dispense Auth. Provider   colchicine 0.6 MG tablet One by mouth three times a day for five days for gout pain. 15  tablet Particia NearingLane, Sonam Huelsmann [place], [place]-C      PDMP not reviewed this encounter.   Roosvelt MaserLane, Pharell Rolfson [place], [place]-C 05/20/22 [phone]

## 2022-07-16 ENCOUNTER — Ambulatory Visit
Admission: EM | Admit: 2022-07-16 | Discharge: 2022-07-16 | Disposition: A | Discharge status: Home / Self Care | Payer: Medicare Other | Attending: Nurse Practitioner | Admitting: Nurse Practitioner

## 2022-07-16 DIAGNOSIS — M109 Gout, unspecified: Secondary | ICD-10-CM | POA: Diagnosis not present

## 2022-07-16 MED ORDER — PREDNISONE 50 MG PO TABS: ORAL_TABLET | ORAL | 0 refills | Status: AC

## 2022-07-16 MED ORDER — DEXAMETHASONE SODIUM PHOSPHATE 10 MG/ML IJ SOLN
10.0000 mg | INTRAMUSCULAR | Status: AC
  Administered 2022-07-16: 10 mg via INTRAMUSCULAR

## 2022-07-16 MED ORDER — KETOROLAC TROMETHAMINE 30 MG/ML IJ SOLN
30.0000 mg | Freq: Once | INTRAMUSCULAR | Status: AC
  Administered 2022-07-16: 30 mg via INTRAMUSCULAR

## 2022-07-16 NOTE — ED Triage Notes (Signed)
Pt presents with left foot pain, has h/o gout

## 2022-07-16 NOTE — Discharge Instructions (Addendum)
Take medication as prescribed. Recommend making dietary modifications to help reduce gout flares.  Information has been provided today. Continue use of your compression socks. Elevate the left lower extremity to help with swelling. Continue the colchicine and allopurinol as prescribed. Please follow-up with your primary care physician for reevaluation.

## 2022-07-16 NOTE — ED Provider Notes (Signed)
[facility]    CSN: [id number] Arrival date & time: 07/16/22  1856      History   Chief Complaint Chief Complaint  Patient presents with   Foot Pain    HPI Cesar Harris is a 71 y.o. male.   The history is provided by the patient.   Patient presents for pain and swelling in the left great toe and foot that been present for the past 2 days.  Patient reports known history of gout.  Patient states that he has been taking allopurinol and colchicine for his symptoms for the past 2 days, has not taken anything today.  He complains of left foot swelling, redness, and pain denies any known injury.  Patient states the last time he had a flare he received Decadron and Toradol which helped with his symptoms.  The patient was told by his doctor that he needed to wear compression socks.  Denies fever, chills, numbness, tingling, loss of range of motion.  Past Medical History:  Diagnosis Date   Barrett esophagus    COVID    Gout    History of kidney stones    Hypertension     Patient Active Problem List   Diagnosis Date Noted   Elevated PSA 11/07/2020   Benign localized prostatic hyperplasia with lower urinary tract symptoms (LUTS) 11/07/2020   Nocturia 11/07/2020   Barrett's esophagus 06/11/2016   History of colonic polyps    Diverticulosis of colon without hemorrhage    Mucosal abnormality of esophagus    History of adenomatous polyp of colon 04/09/2016   Dysphagia 04/09/2016    Past Surgical History:  Procedure Laterality Date   BACK SURGERY  1998   l4 anf l5 herniated disc   BIOPSY  08/08/2021   Procedure: BIOPSY;  Surgeon: Lanelle Bal, DO;  [facility];  Service: Endoscopy;;   CATARACT EXTRACTION W/PHACO  04/03/2012   Procedure: CATARACT EXTRACTION PHACO AND INTRAOCULAR LENS PLACEMENT (IOC);  Surgeon: Gemma Payor, MD;  Location: [facility];  Service: Ophthalmology;  Laterality: Left;  CDE: 8.94   CATARACT EXTRACTION W/PHACO Right 11/14/2015    Procedure: CATARACT EXTRACTION PHACO AND INTRAOCULAR LENS PLACEMENT RIGHT EYE;  Surgeon: Gemma Payor, MD;  Location: [facility];  Service: Ophthalmology;  Laterality: Right;  CDE:8.19   COLONOSCOPY N/A 04/19/2016   Procedure: COLONOSCOPY;  Surgeon: Corbin Ade, MD;  [facility];  Service: Endoscopy;  Laterality: N/A;  200 - moved to 1:15 - office to notify   COLONOSCOPY WITH PROPOFOL N/A 08/08/2021   Procedure: COLONOSCOPY WITH PROPOFOL;  Surgeon: Lanelle Bal, DO;  [facility];  Service: Endoscopy;  Laterality: N/A;  7:30am   ESOPHAGOGASTRODUODENOSCOPY N/A 04/19/2016   Procedure: ESOPHAGOGASTRODUODENOSCOPY (EGD);  Surgeon: Corbin Ade, MD;  [facility];  Service: Endoscopy;  Laterality: N/A;   ESOPHAGOGASTRODUODENOSCOPY (EGD) WITH PROPOFOL N/A 08/08/2021   Procedure: ESOPHAGOGASTRODUODENOSCOPY (EGD) WITH PROPOFOL;  Surgeon: Lanelle Bal, DO;  [facility];  Service: Endoscopy;  Laterality: N/A;   HERNIA REPAIR     left and right inguinal- Royston Bake   MALONEY DILATION N/A 04/19/2016   Procedure: Elease Hashimoto DILATION;  Surgeon: Corbin Ade, MD;  [facility];  Service: Endoscopy;  Laterality: N/A;       Home Medications    Prior to Admission medications   Medication Sig Start Date End Date Taking? Authorizing Provider  predniSONE (DELTASONE) 50 MG tablet Take 1 tablet daily with  breakfast for 5 days. 07/16/22  Yes Socorro Kanitz-Warren, Sadie Haber, NP  acetaminophen (TYLENOL) 325 MG tablet Take 2 tablets (650 mg total) by mouth every 6 (six) hours as needed for moderate pain. 04/13/22   Wallis Bamberg, PA-C  allopurinol (ZYLOPRIM) 300 MG tablet Take 1 tablet (300 mg total) by mouth daily. 04/03/22   Darreld Mclean, MD  amLODipine-benazepril (LOTREL) 5-10 MG per capsule Take 1 capsule by mouth daily.    [provider]  colchicine 0.6 MG tablet One by mouth three times a day for five days for gout pain. 05/20/22   [name],  PA-C  mirabegron ER (MYRBETRIQ) 25 MG TB24 tablet Take 1 tablet (25 mg total) by mouth daily. 11/08/21   McKenzie, Mardene Celeste, MD  pantoprazole (PROTONIX) 40 MG tablet TAKE 1 TABLET BY MOUTH EVERY DAY 09/29/18   Gelene Mink, NP  polyethylene glycol-electrolytes (TRILYTE) 420 g solution Take 4,000 mLs by mouth as directed. 07/06/21   Lanelle Bal, DO  SAW PALMETTO, SERENOA REPENS, PO Take 900 mg by mouth 2 (two) times daily.    [provider]  silodosin (RAPAFLO) 8 MG CAPS capsule Take 1 capsule (8 mg total) by mouth at bedtime. 11/08/21   McKenzie, Mardene Celeste, MD  tiZANidine (ZANAFLEX) 4 MG tablet Take 1 tablet (4 mg total) by mouth at bedtime. 04/13/22   Wallis Bamberg, PA-C    Family History Family History  Problem Relation Age of Onset   Anesthesia problems Neg Hx    Hypotension Neg Hx    Malignant hyperthermia Neg Hx    Pseudochol deficiency Neg Hx    Colon cancer Neg Hx     Social History Social History   Tobacco Use   Smoking status: Never   Smokeless tobacco: Never  Vaping Use   Vaping Use: Never used  Substance Use Topics   Alcohol use: No    Alcohol/week: 0.0 standard drinks of alcohol   Drug use: No     Allergies   Patient has no known allergies.   Review of Systems Review of Systems Per HPI  Physical Exam Triage Vital Signs ED Triage Vitals [07/16/22 1929]  Enc Vitals Group     BP (!) 160/88     Pulse Rate 83     Resp 17     Temp 98 F (36.7 C)     Temp Source Oral     SpO2 94 %     Weight      Height      Head Circumference      Peak Flow      Pain Score 10     Pain Loc      Pain Edu?      Excl. in GC?    No data found.  Updated Vital Signs BP (!) 160/88 (BP Location: Right Arm)   Pulse 83   Temp 98 F (36.7 C) (Oral)   Resp 17   SpO2 94%   Visual Acuity Right Eye Distance:   Left Eye Distance:   Bilateral Distance:    Right Eye Near:   Left Eye Near:    Bilateral Near:     Physical Exam Vitals and nursing note  reviewed.  Constitutional:      General: He is not in acute distress.    Appearance: Normal appearance.  HENT:     Head: Normocephalic.  Eyes:     Extraocular Movements: Extraocular movements intact.     Pupils: Pupils are equal,  round, and reactive to light.  Cardiovascular:     Rate and Rhythm: Normal rate and regular rhythm.     Pulses: Normal pulses.     Heart sounds: Normal heart sounds.  Pulmonary:     Effort: Pulmonary effort is normal.     Breath sounds: Normal breath sounds.  Abdominal:     General: Bowel sounds are normal.     Palpations: Abdomen is soft.  Musculoskeletal:     Cervical back: Normal range of motion.     Left ankle: Swelling present.     Left foot: Normal range of motion and normal capillary refill. Swelling and tenderness present. No crepitus. Normal pulse.     Comments: Localized edema, erythema, warmth, tenderness left great toe and left foot  Lymphadenopathy:     Cervical: No cervical adenopathy.  Skin:    General: Skin is warm and dry.     Findings: Erythema (left great toe/foot) present.  Neurological:     General: No focal deficit present.     Mental Status: He is alert and oriented to person, place, and time.  Psychiatric:        Mood and Affect: Mood normal.        Behavior: Behavior normal.      UC Treatments / Results  Labs (all labs ordered are listed, but only abnormal results are displayed) Labs Reviewed - No data to display  EKG   Radiology No results found.  Procedures Procedures (including critical care time)  Medications Ordered in UC Medications  dexamethasone (DECADRON) injection 10 mg (has no administration in time range)  ketorolac (TORADOL) 30 MG/ML injection 30 mg (has no administration in time range)    Initial Impression / Assessment and Plan / UC Course  I have reviewed the triage vital signs and the nursing notes.  Pertinent labs & imaging results that were available during my care of the patient were  reviewed by me and considered in my medical decision making (see chart for details).  Patient with known history of gout presents with symptoms consistent with gout flare.  Patient was given Toradol and Decadron IM today.  Patient advised to make dietary modifications to help control his gout flares.  Patient advised to continue colchicine and allopurinol as prescribed.  Follow-up with PCP for recheck. Final Clinical Impressions(s) / UC Diagnoses   Final diagnoses:  Gout involving toe of left foot, unspecified cause, unspecified chronicity     Discharge Instructions      Take medication as prescribed. Recommend making dietary modifications to help reduce gout flares.  Information has been provided today. Continue use of your compression socks. Elevate the left lower extremity to help with swelling. Continue the colchicine and allopurinol as prescribed. Please follow-up with your primary care physician for reevaluation.     ED Prescriptions     Medication Sig Dispense Auth. Provider   predniSONE (DELTASONE) 50 MG tablet Take 1 tablet daily with breakfast for 5 days. 5 tablet [place] Vandergriff-Warren, Sadie Haber, NP      PDMP not reviewed this encounter.   Abran Cantor, NP 07/16/22 1955

## 2022-08-07 IMAGING — DX DG KNEE COMPLETE 4+V*R*
4 series · 4 of 4 positions shown · non-contrast
Comparison: 07/24/2009

CLINICAL DATA: Right knee pain without specific injury. Duration of
symptoms 1 month.

EXAM:
RIGHT KNEE - COMPLETE 4+ VIEW

[knee ap]
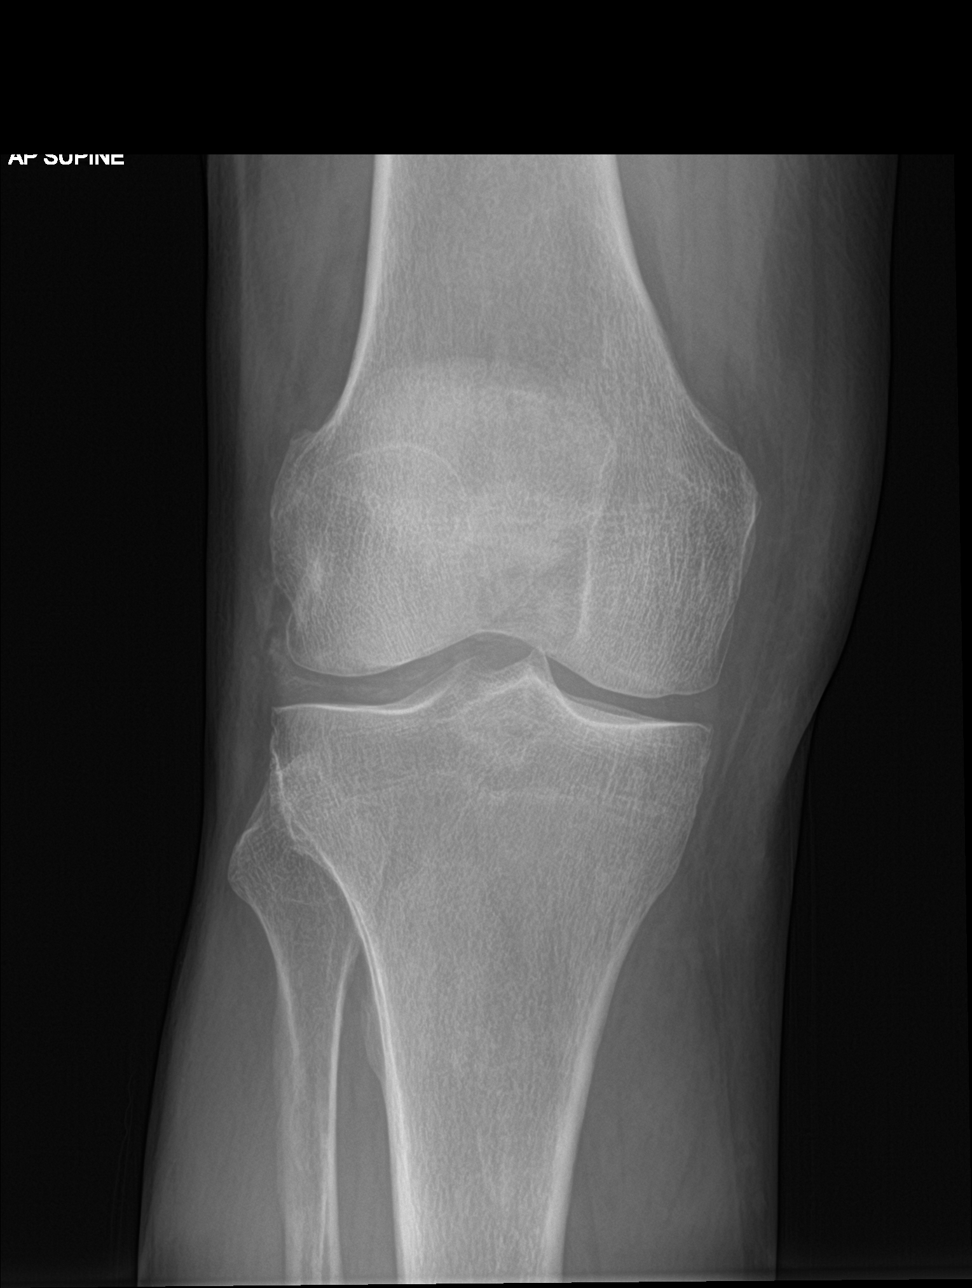

[knee obl (1 of 2)]
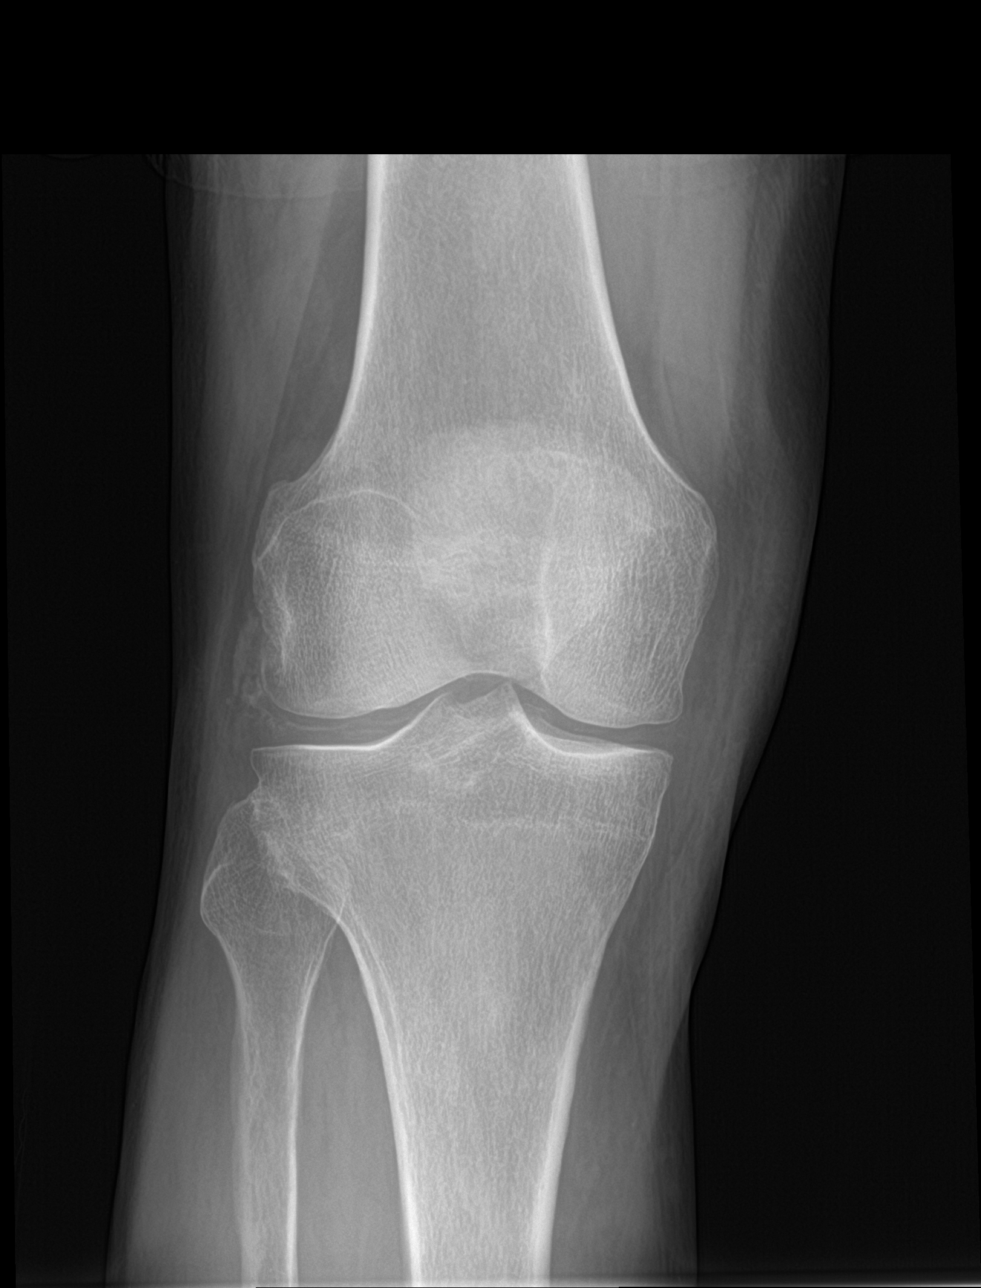

[knee obl (2 of 2)]
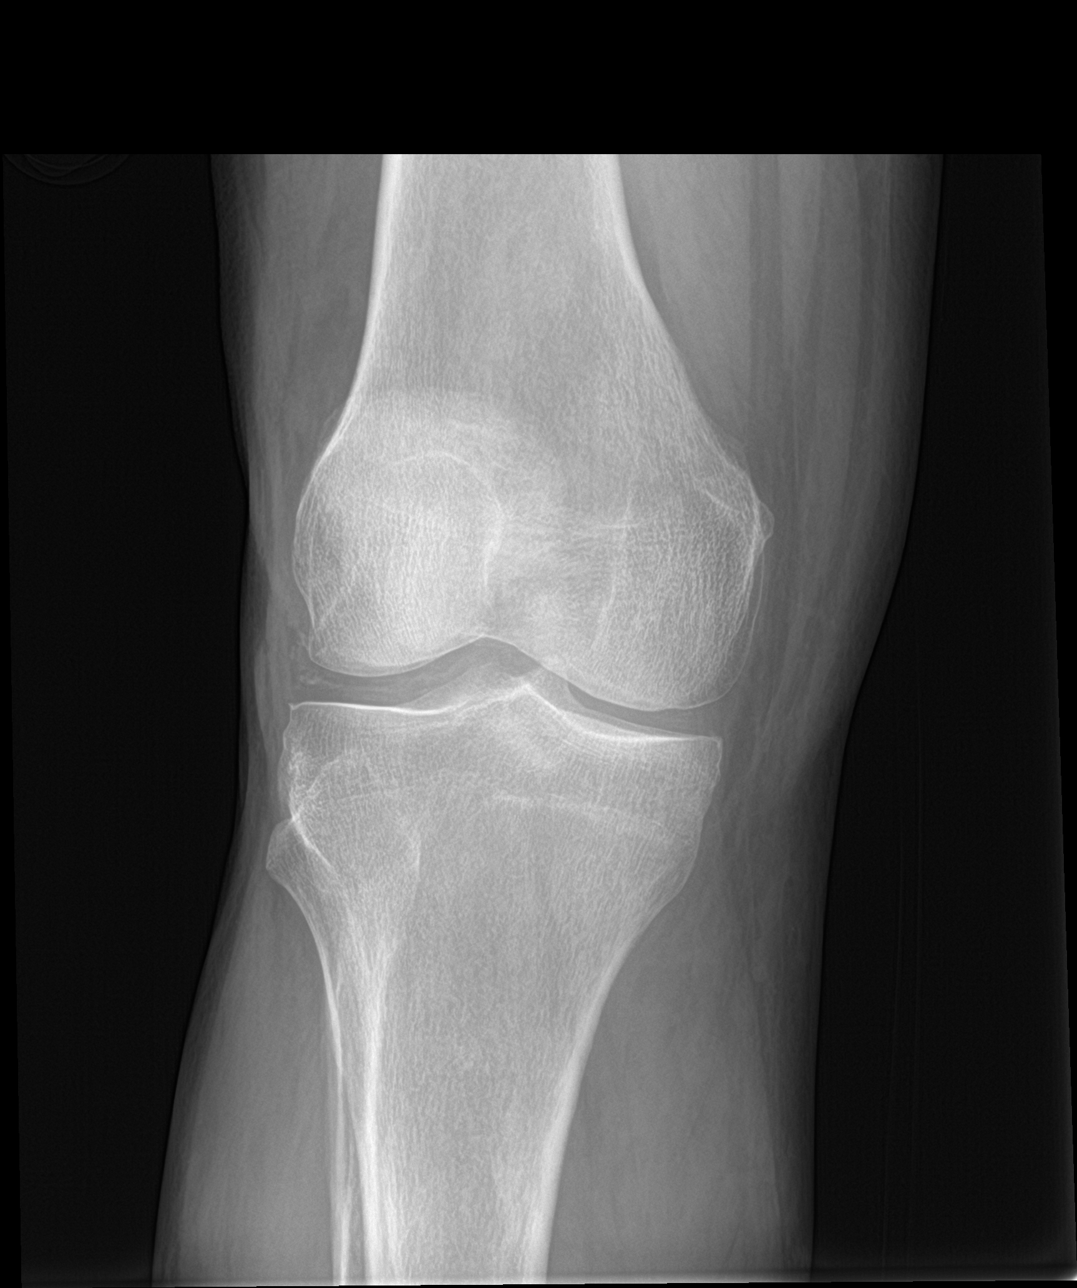

[knee lat]
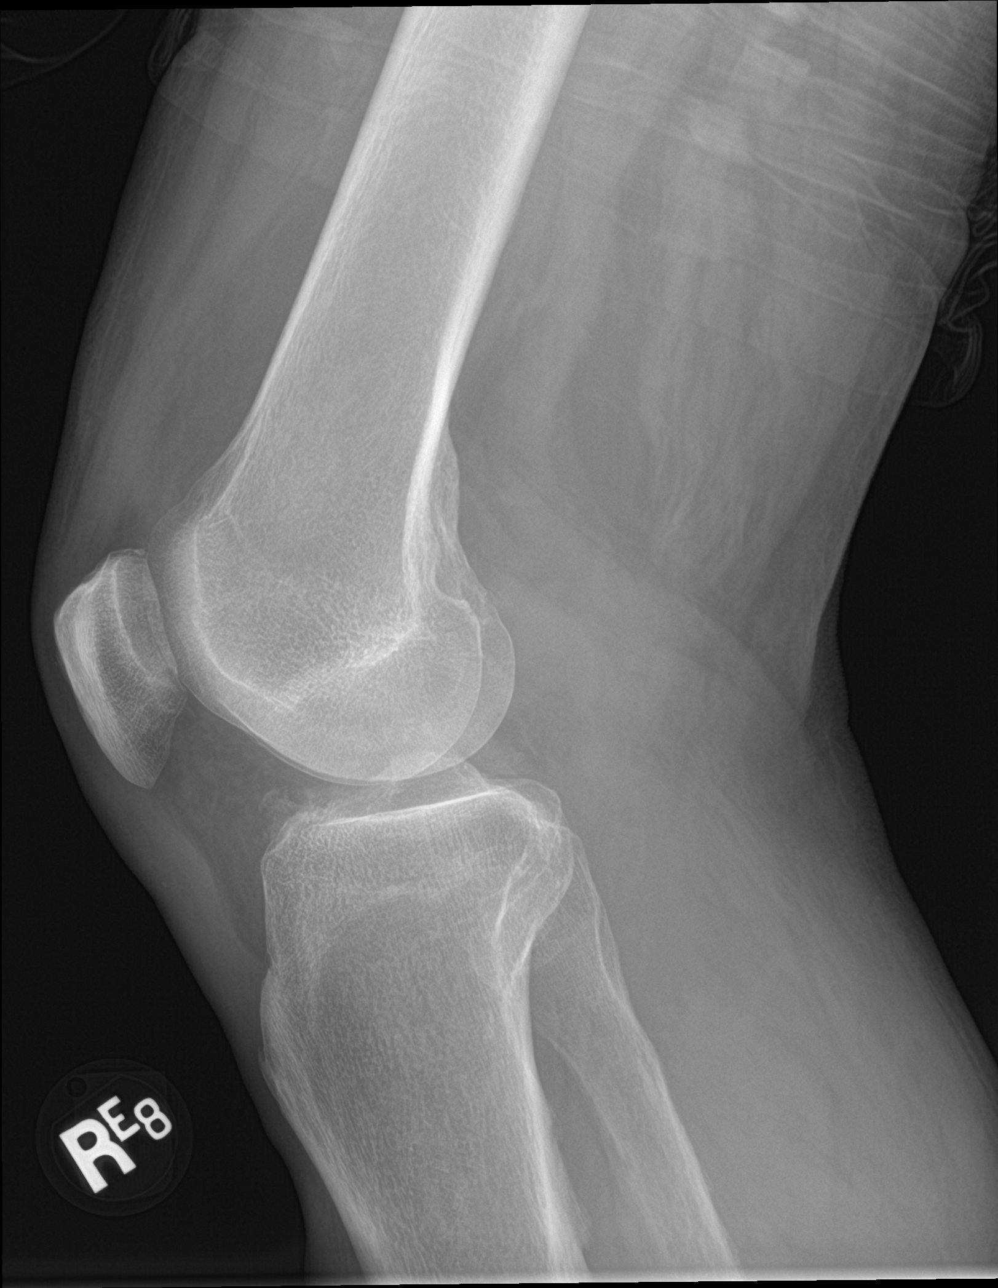

[4 of 4 positions shown; findings below may reference images not displayed]

FINDINGS: Small knee joint effusion. No joint space narrowing or osteophyte
formation. The patient does have chondrocalcinosis of the menisci.
No evidence of bone erosion or traumatic finding.
IMPRESSION: Small knee joint effusion. Chondrocalcinosis of the menisci. No
other finding.

## 2022-08-23 IMAGING — MR MR KNEE*R* W/O CM
7 series · 40 of 40 positions shown · non-contrast
Comparison: X-ray Knee 09/11/2021.  X-ray Knee 07/24/2009.

CLINICAL DATA: Patient complains of right knee pain x 2 months. No
trauma, but a history of gout.

EXAM:
MRI OF THE RIGHT KNEE WITHOUT CONTRAST
TECHNIQUE: Multiplanar, multisequence MR imaging of the knee was performed. No
intravenous contrast was administered.

[Series 8: T2 fat-sat · axial · right · 4.0mm · 0.47mm/px · z∈[-88,+37]mm · 5 of 26 slices shown (1 of 3)]
[im 1/26]
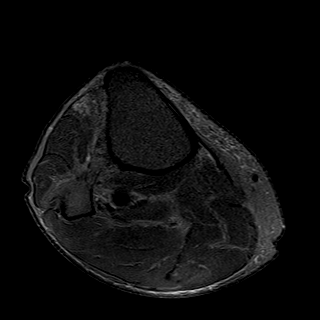
[im 7/26]
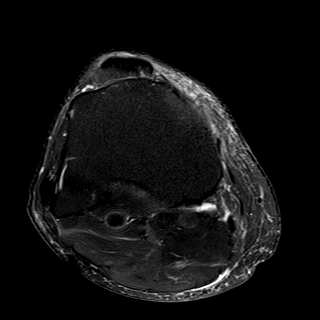
[im 13/26]
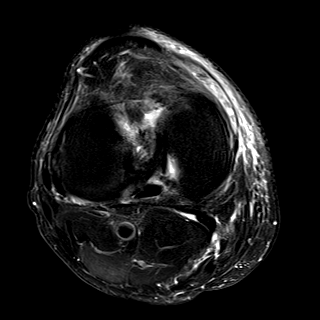
[im 19/26]
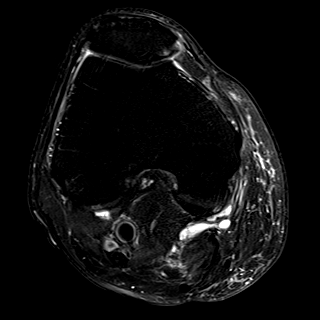
[im 26/26]
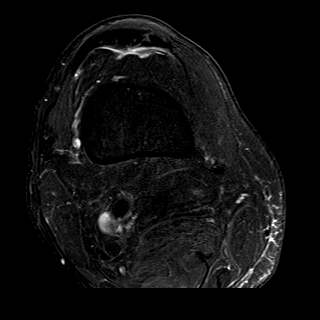

[Series 9: T1 · coronal · right · 4.0mm · 0.59mm/px · 5 of 27 slices shown]
[im 1/27]
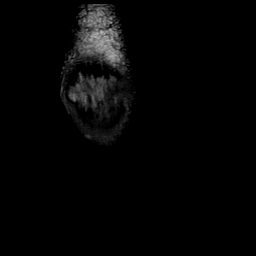
[im 7/27]
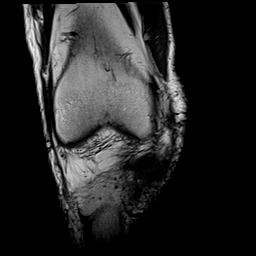
[im 14/27]
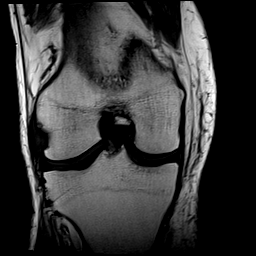
[im 20/27]
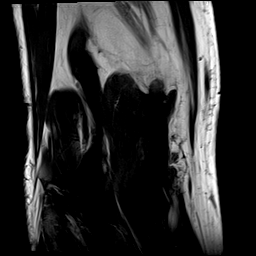
[im 27/27]
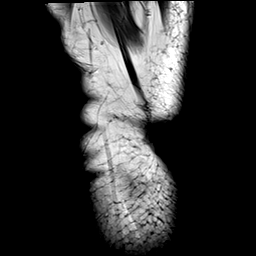

[Series 10: T2 fat-sat · coronal · right · 4.0mm · 0.59mm/px · 6 of 28 slices shown (2 of 3)]
[im 1/28]
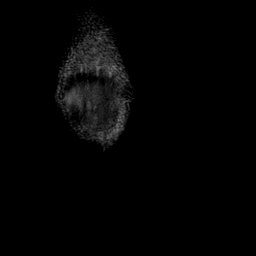
[im 6/28]
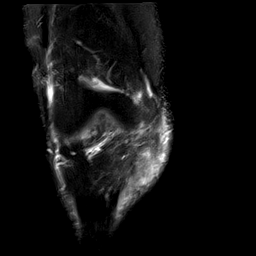
[im 11/28]
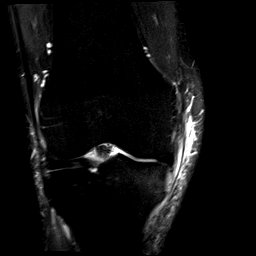
[im 17/28]
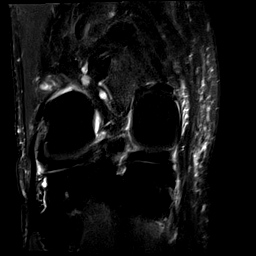
[im 22/28]
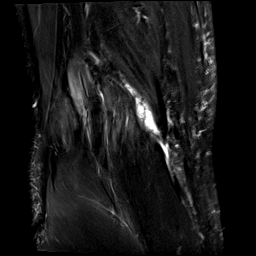
[im 28/28]
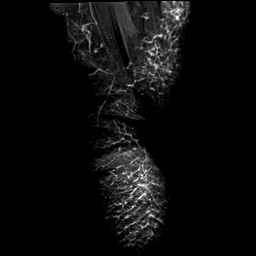

[Series 11: PD fat-sat · coronal · right · 3.0mm · 0.59mm/px · 8 of 40 slices shown (1 of 2)]
[im 1/40]
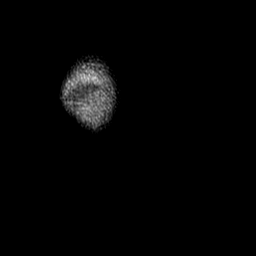
[im 6/40]
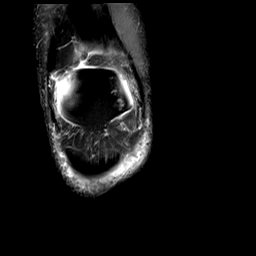
[im 12/40]
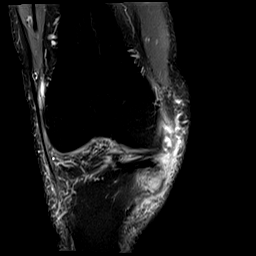
[im 17/40]
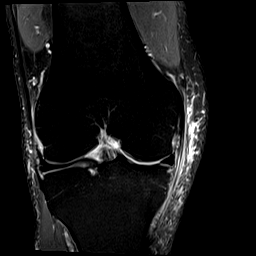
[im 23/40]
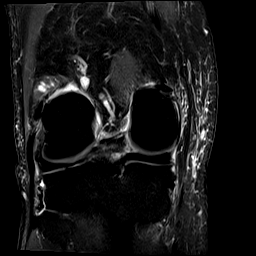
[im 28/40]
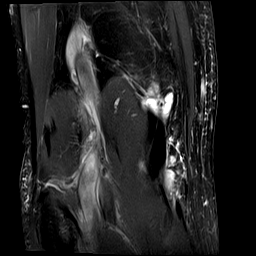
[im 34/40]
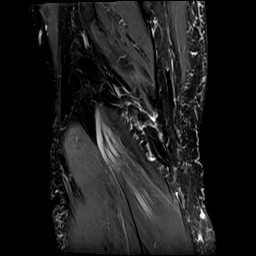
[im 40/40]
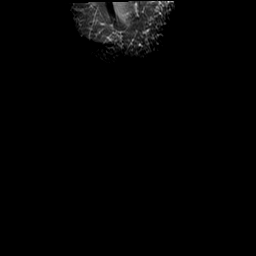

[Series 12: PD fat-sat · sagittal · right · 3.0mm · 0.59mm/px · 6 of 28 slices shown (2 of 2)]
[im 1/28]
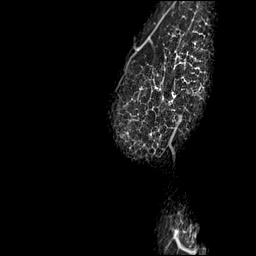
[im 6/28]
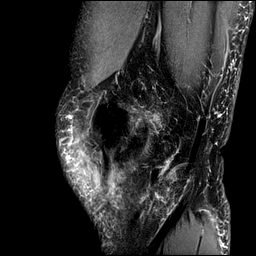
[im 11/28]
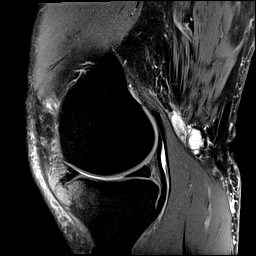
[im 17/28]
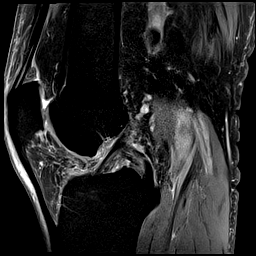
[im 22/28]
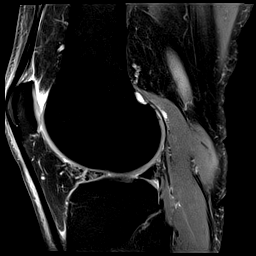
[im 28/28]
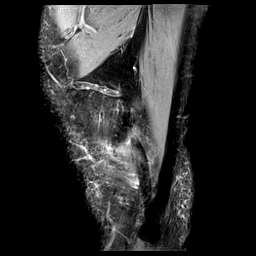

[Series 13: T2 fat-sat · sagittal · right · 3.0mm · 0.59mm/px · 6 of 28 slices shown (3 of 3)]
[im 1/28]
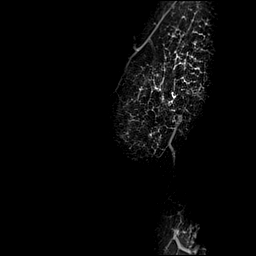
[im 6/28]
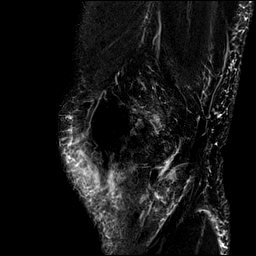
[im 11/28]
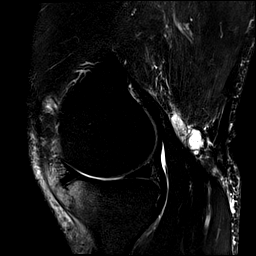
[im 17/28]
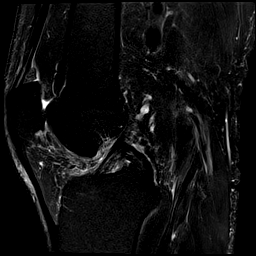
[im 22/28]
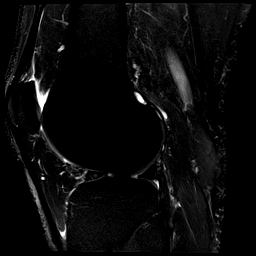
[im 28/28]
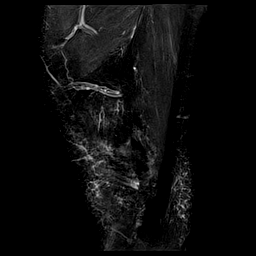

[Series 14: PD · coronal · right · 2.0mm · 0.47mm/px · 4 of 20 slices shown]
[im 1/20]
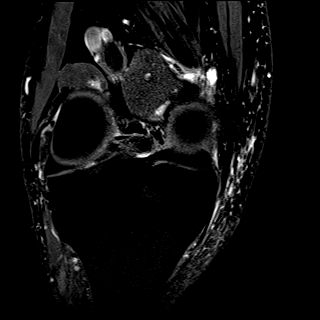
[im 7/20]
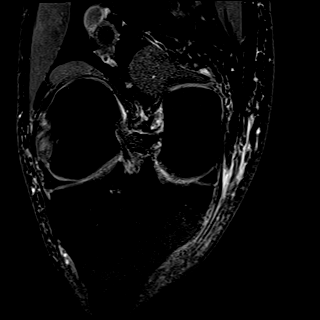
[im 13/20]
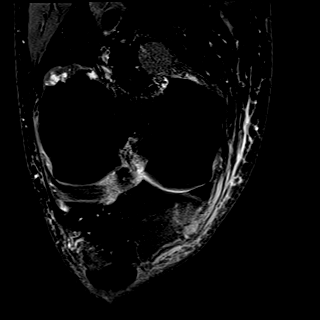
[im 20/20]
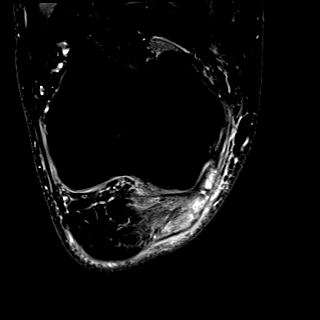

[40 of 40 positions shown; findings below may reference images not displayed]

FINDINGS: MENISCI

Medial meniscus:  Intact

Lateral meniscus: Radial tear of the posterior horn of the lateral
meniscus towards the meniscal root.

LIGAMENTS

Cruciates:  Intact ACL and PCL.

Collaterals: Medial collateral ligament is intact. Lateral
collateral ligament complex is intact.

CARTILAGE

Patellofemoral: Partial-thickness cartilage loss of the medial
patellofemoral compartment.

Medial: Mild partial-thickness cartilage loss of the medial
femorotibial compartment.

Lateral:  No chondral defect.

Joint: No joint effusion. Mild edema in Hoffa's fat. No plical
thickening.

Popliteal Fossa: Severe tendinosis of the popliteus tendon at the
femoral insertion. Small Baker's cyst.

Extensor Mechanism: Intact quadriceps tendon. Intact patellar
tendon. Intact medial patellar retinaculum. Intact lateral patellar
retinaculum. Intact MPFL.

Bones:  No acute osseous abnormality.  No aggressive osseous lesion.

Other: No fluid collection or hematoma.  Muscles are normal.
IMPRESSION: 1. Radial tear of the posterior horn of the lateral meniscus towards
the meniscal root.
2. Severe tendinosis of the popliteus tendon at the femoral
insertion.
3. Partial-thickness cartilage loss of the medial patellofemoral
compartment.
4. Mild partial-thickness cartilage loss of the medial femorotibial
compartment.

## 2022-09-25 ENCOUNTER — Telehealth: Payer: Self-pay | Admitting: Orthopaedic Surgery

## 2022-10-07 ENCOUNTER — Other Ambulatory Visit: Payer: Self-pay | Admitting: Urology

## 2022-10-07 DIAGNOSIS — N401 Enlarged prostate with lower urinary tract symptoms: Secondary | ICD-10-CM

## 2022-10-29 ENCOUNTER — Other Ambulatory Visit: Payer: Medicare Other

## 2022-11-07 ENCOUNTER — Encounter: Payer: Self-pay | Admitting: Urology

## 2022-11-07 ENCOUNTER — Ambulatory Visit: Payer: Medicare Other | Admitting: Urology

## 2022-11-07 VITALS — BP 159/96 | HR 75

## 2022-11-07 DIAGNOSIS — R351 Nocturia: Secondary | ICD-10-CM

## 2022-11-07 DIAGNOSIS — N401 Enlarged prostate with lower urinary tract symptoms: Secondary | ICD-10-CM

## 2022-11-07 DIAGNOSIS — R972 Elevated prostate specific antigen [PSA]: Secondary | ICD-10-CM | POA: Diagnosis not present

## 2022-11-07 MED ORDER — SILODOSIN 8 MG PO CAPS: 8.0000 mg | ORAL_CAPSULE | Freq: Every day | ORAL | 11 refills | Status: DC

## 2022-11-07 NOTE — Progress Notes (Signed)
11/07/2022 9:56 AM   Cesar Harris [birth year] [id number]  Referring provider: Elfredia Nevins, MD [address] [place],  [place] [address]  Followup BPh and elevated PSA   HPI: Mr Cesar Harris is a 71yo here for followup for BPH and elevated PSA. IPSS 10 QOL 2 on silodosin. Nocturia 2-3x. Urine stream strong. No straining to urinate. Patient on rapaflo 8mg  daily. No recent PSA.    PMH: Past Medical History:  Diagnosis Date   Barrett esophagus    COVID    Gout    History of kidney stones    Hypertension     Surgical History: Past Surgical History:  Procedure Laterality Date   BACK SURGERY  1998   l4 anf l5 herniated disc   BIOPSY  08/08/2021   Procedure: BIOPSY;  Surgeon: 10/08/2021, DO;  [facility];  Service: Endoscopy;;   CATARACT EXTRACTION W/PHACO  04/03/2012   Procedure: CATARACT EXTRACTION PHACO AND INTRAOCULAR LENS PLACEMENT (IOC);  Surgeon: 06/03/2012, MD;  Location: [facility];  Service: Ophthalmology;  Laterality: Left;  CDE: 8.94   CATARACT EXTRACTION W/PHACO Right 11/14/2015   Procedure: CATARACT EXTRACTION PHACO AND INTRAOCULAR LENS PLACEMENT RIGHT EYE;  Surgeon: 11/16/2015, MD;  Location: [facility];  Service: Ophthalmology;  Laterality: Right;  CDE:8.19   COLONOSCOPY N/A 04/19/2016   Procedure: COLONOSCOPY;  Surgeon: 04/21/2016, MD;  [facility];  Service: Endoscopy;  Laterality: N/A;  200 - moved to 1:15 - office to notify   COLONOSCOPY WITH PROPOFOL N/A 08/08/2021   Procedure: COLONOSCOPY WITH PROPOFOL;  Surgeon: 10/08/2021, DO;  [facility];  Service: Endoscopy;  Laterality: N/A;  7:30am   ESOPHAGOGASTRODUODENOSCOPY N/A 04/19/2016   Procedure: ESOPHAGOGASTRODUODENOSCOPY (EGD);  Surgeon: 04/21/2016, MD;  [facility];  Service: Endoscopy;  Laterality: N/A;   ESOPHAGOGASTRODUODENOSCOPY (EGD) WITH PROPOFOL N/A 08/08/2021   Procedure: ESOPHAGOGASTRODUODENOSCOPY (EGD) WITH PROPOFOL;  Surgeon: 10/08/2021, DO;  [facility];  Service: Endoscopy;  Laterality: N/A;   HERNIA REPAIR     left and right inguinal- Lanelle Bal   MALONEY DILATION N/A 04/19/2016   Procedure: 04/21/2016 DILATION;  Surgeon: Elease Hashimoto, MD;  [facility];  Service: Endoscopy;  Laterality: N/A;    Home Medications:  Allergies as of 11/07/2022   No Known Allergies      Medication List        Accurate as of November 07, 2022  9:56 AM. If you have any questions, ask your nurse or doctor.          acetaminophen 325 MG tablet Commonly known as: Tylenol Take 2 tablets (650 mg total) by mouth every 6 (six) hours as needed for moderate pain.   allopurinol 300 MG tablet Commonly known as: ZYLOPRIM TAKE 1 TABLET BY MOUTH EVERY DAY   amLODipine-benazepril 5-10 MG capsule Commonly known as: LOTREL Take 1 capsule by mouth daily.   colchicine 0.6 MG tablet One by mouth three times a day for five days for gout pain.   mirabegron ER 25 MG Tb24 tablet Commonly known as: MYRBETRIQ Take 1 tablet (25 mg total) by mouth daily.   pantoprazole 40 MG tablet Commonly known as: PROTONIX TAKE 1 TABLET BY MOUTH EVERY DAY   polyethylene glycol-electrolytes 420 g solution Commonly known as: TriLyte Take 4,000 mLs by mouth as directed.   predniSONE 50 MG tablet Commonly known as: DELTASONE Take 1 tablet daily with breakfast for  5 days.   SAW PALMETTO (SERENOA REPENS) PO Take 900 mg by mouth 2 (two) times daily.   silodosin 8 MG Caps capsule Commonly known as: RAPAFLO Take 8 mg by mouth daily with breakfast.   tamsulosin 0.4 MG Caps capsule Commonly known as: FLOMAX Take 1 capsule (0.4 mg total) by mouth daily after supper.   tiZANidine 4 MG tablet Commonly known as: Zanaflex Take 1 tablet (4 mg total) by mouth at bedtime.        Allergies: No Known Allergies  Family History: Family History  Problem Relation Age of Onset   Anesthesia problems Neg Hx    Hypotension Neg Hx     Malignant hyperthermia Neg Hx    Pseudochol deficiency Neg Hx    Colon cancer Neg Hx     Social History:  reports that he has never smoked. He has never used smokeless tobacco. He reports that he does not drink alcohol and does not use drugs.  ROS: All other review of systems were reviewed and are negative except what is noted above in HPI  Physical Exam: BP (!) 159/96   Pulse 75   Constitutional:  Alert and oriented, No acute distress. HEENT: [place] AT, moist mucus membranes.  Trachea midline, no masses. Cardiovascular: No clubbing, cyanosis, or edema. Respiratory: Normal respiratory effort, no increased work of breathing. GI: Abdomen is soft, nontender, nondistended, no abdominal masses GU: No CVA tenderness.  Lymph: No cervical or inguinal lymphadenopathy. Skin: No rashes, bruises or suspicious lesions. Neurologic: Grossly intact, no focal deficits, moving all 4 extremities. Psychiatric: Normal mood and affect.  Laboratory Data: Lab Results  Component Value Date   WBC 7.5 11/08/2015   HGB 15.7 11/08/2015   HCT 46.3 11/08/2015   MCV 85.3 11/08/2015   PLT 180 11/08/2015    Lab Results  Component Value Date   CREATININE 1.39 (H) 04/13/2022    No results found for: "PSA"  No results found for: "TESTOSTERONE"  No results found for: "HGBA1C"  Urinalysis    Component Value Date/Time   APPEARANCEUR Clear 11/08/2021 1036   GLUCOSEU Negative 11/08/2021 1036   BILIRUBINUR Negative 11/08/2021 1036   PROTEINUR 1+ (A) 11/08/2021 1036   NITRITE Negative 11/08/2021 1036   LEUKOCYTESUR Negative 11/08/2021 1036    Lab Results  Component Value Date   LABMICR See below: 11/08/2021   WBCUA 0-5 11/08/2021   LABEPIT 0-10 11/08/2021   MUCUS Present 11/08/2021   BACTERIA Few 11/08/2021    Pertinent Imaging:  No results found for this or any previous visit.  No results found for this or any previous visit.  No results found for this or any previous visit.  No results  found for this or any previous visit.  No results found for this or any previous visit.  No valid procedures specified. No results found for this or any previous visit.  No results found for this or any previous visit.   Assessment & Plan:    1. Elevated PSA -PSA today, if stable followup 1 year with PSA - Urinalysis, Routine w reflex microscopic  2. Nocturia Rapaflo 8mg  daily  3. Benign localized prostatic hyperplasia with lower urinary tract symptoms (LUTS) Rapaflo 8mg  daily   No follow-ups on file.  , MD  [facility] [place]

## 2022-11-07 NOTE — Patient Instructions (Signed)

## 2022-11-08 LAB — MICROSCOPIC EXAMINATION: Bacteria, UA: NONE SEEN

## 2022-11-08 LAB — URINALYSIS, ROUTINE W REFLEX MICROSCOPIC
Bilirubin, UA: NEGATIVE
Glucose, UA: NEGATIVE
Ketones, UA: NEGATIVE
Leukocytes,UA: NEGATIVE
Nitrite, UA: NEGATIVE
Specific Gravity, UA: 1.02 (ref 1.005–1.030)
Urobilinogen, Ur: 0.2 mg/dL (ref 0.2–1.0)
pH, UA: 6 (ref 5.0–7.5)

## 2022-11-08 LAB — PSA: Prostate Specific Ag, Serum: 6.4 ng/mL — ABNORMAL HIGH (ref 0.0–4.0)

## 2023-01-06 ENCOUNTER — Ambulatory Visit
Admission: EM | Admit: 2023-01-06 | Discharge: 2023-01-06 | Disposition: A | Discharge status: Home / Self Care | Payer: Medicare Other | Attending: Nurse Practitioner | Admitting: Nurse Practitioner

## 2023-01-06 DIAGNOSIS — M109 Gout, unspecified: Secondary | ICD-10-CM | POA: Diagnosis not present

## 2023-01-06 MED ORDER — METHYLPREDNISOLONE SODIUM SUCC 125 MG IJ SOLR
80.0000 mg | Freq: Once | INTRAMUSCULAR | Status: AC
  Administered 2023-01-06: 80 mg via INTRAMUSCULAR

## 2023-01-06 NOTE — Discharge Instructions (Addendum)
Continue the allopurinol that you are currently taking.  Recommend following up with your primary care physician to have your kidney function checked with use of the colchicine.  Based on your most recent lab work seen from April, kidney function was slightly elevated and colchicine can worsen this result. Continue to monitor your diet recommending a low purine eating plan.  Information has been provided to you today. Go to the emergency department if you experience worsening pain, redness, become unable to walk, or have other concerns. Recommend following up with your primary care physician for reevaluation of your gout within the next 2 to 4 weeks. Follow-up as needed.

## 2023-01-06 NOTE — ED Provider Notes (Signed)
[facility]    CSN: [id number] Arrival date & time: 01/06/23  0809      History   Chief Complaint Chief Complaint  Patient presents with   Gout    HPI Cesar Harris is a 72 y.o. male.   The history is provided by the patient.   The patient presents for complaints of persistent right knee and right great toe gout flare up x 1-2 days.  Denies a injury.  Localizes the pain to the front of knee and to the side of the right great toe.  Describes the pain as constant and achy in character.  Patient reports he has been taking colchicine for his symptoms along with his allopurinol.  He states that the colchicine has appeared to help.  Symptoms are made worse to the touch.  Reports similar symptoms in the past.  Denies fever, chills, ecchymosis, weakness, numbness and tingling.   Past Medical History:  Diagnosis Date   Barrett esophagus    COVID    Gout    History of kidney stones    Hypertension     Patient Active Problem List   Diagnosis Date Noted   Elevated PSA 11/07/2020   Benign localized prostatic hyperplasia with lower urinary tract symptoms (LUTS) 11/07/2020   Nocturia 11/07/2020   Barrett's esophagus 06/11/2016   History of colonic polyps    Diverticulosis of colon without hemorrhage    Mucosal abnormality of esophagus    History of adenomatous polyp of colon 04/09/2016   Dysphagia 04/09/2016    Past Surgical History:  Procedure Laterality Date   BACK SURGERY  1998   l4 anf l5 herniated disc   BIOPSY  08/08/2021   Procedure: BIOPSY;  Surgeon: Lanelle Bal, DO;  [facility];  Service: Endoscopy;;   CATARACT EXTRACTION W/PHACO  04/03/2012   Procedure: CATARACT EXTRACTION PHACO AND INTRAOCULAR LENS PLACEMENT (IOC);  Surgeon: Gemma Payor, MD;  [facility]: [facility];  Service: Ophthalmology;  Laterality: Left;  CDE: 8.94   CATARACT EXTRACTION W/PHACO Right 11/14/2015   Procedure: CATARACT EXTRACTION PHACO AND INTRAOCULAR LENS PLACEMENT RIGHT EYE;   Surgeon: Gemma Payor, MD;  [facility]: [facility];  Service: Ophthalmology;  Laterality: Right;  CDE:8.19   COLONOSCOPY N/A 04/19/2016   Procedure: COLONOSCOPY;  Surgeon: Corbin Ade, MD;  [facility];  Service: Endoscopy;  Laterality: N/A;  200 - moved to 1:15 - office to notify   COLONOSCOPY WITH PROPOFOL N/A 08/08/2021   Procedure: COLONOSCOPY WITH PROPOFOL;  Surgeon: Lanelle Bal, DO;  [facility];  Service: Endoscopy;  Laterality: N/A;  7:30am   ESOPHAGOGASTRODUODENOSCOPY N/A 04/19/2016   Procedure: ESOPHAGOGASTRODUODENOSCOPY (EGD);  Surgeon: Corbin Ade, MD;  [facility];  Service: Endoscopy;  Laterality: N/A;   ESOPHAGOGASTRODUODENOSCOPY (EGD) WITH PROPOFOL N/A 08/08/2021   Procedure: ESOPHAGOGASTRODUODENOSCOPY (EGD) WITH PROPOFOL;  Surgeon: Lanelle Bal, DO;  [facility];  Service: Endoscopy;  Laterality: N/A;   HERNIA REPAIR     left and right inguinal- Royston Bake   MALONEY DILATION N/A 04/19/2016   Procedure: Elease Hashimoto DILATION;  Surgeon: Corbin Ade, MD;  [facility];  Service: Endoscopy;  Laterality: N/A;       Home Medications    Prior to Admission medications   Medication Sig Start Date End Date Taking? Authorizing Provider  allopurinol (ZYLOPRIM) 300 MG tablet TAKE 1 TABLET BY MOUTH EVERY DAY 09/25/22   Darreld Mclean, MD  acetaminophen (TYLENOL) 325  MG tablet Take 2 tablets (650 mg total) by mouth every 6 (six) hours as needed for moderate pain. 04/13/22   Wallis Bamberg, PA-C  amLODipine-benazepril (LOTREL) 5-10 MG per capsule Take 1 capsule by mouth daily.    [provider]  colchicine 0.6 MG tablet One by mouth three times a day for five days for gout pain. 05/20/22   [name], PA-C  mirabegron ER (MYRBETRIQ) 25 MG TB24 tablet Take 1 tablet (25 mg total) by mouth daily. Patient not taking: Reported on 11/07/2022 11/08/21   Malen Gauze, MD  pantoprazole (PROTONIX) 40 MG tablet TAKE 1  TABLET BY MOUTH EVERY DAY 09/29/18   Gelene Mink, NP  polyethylene glycol-electrolytes (TRILYTE) 420 g solution Take 4,000 mLs by mouth as directed. 07/06/21   Lanelle Bal, DO  predniSONE (DELTASONE) 50 MG tablet Take 1 tablet daily with breakfast for 5 days. 07/16/22   Reginald Mangels-Warren, Sadie Haber, NP  SAW PALMETTO, SERENOA REPENS, PO Take 900 mg by mouth 2 (two) times daily.    [provider]  silodosin (RAPAFLO) 8 MG CAPS capsule Take 1 capsule (8 mg total) by mouth daily with breakfast. 11/07/22   McKenzie, Mardene Celeste, MD  tiZANidine (ZANAFLEX) 4 MG tablet Take 1 tablet (4 mg total) by mouth at bedtime. 04/13/22   Wallis Bamberg, PA-C    Family History Family History  Problem Relation Age of Onset   Anesthesia problems Neg Hx    Hypotension Neg Hx    Malignant hyperthermia Neg Hx    Pseudochol deficiency Neg Hx    Colon cancer Neg Hx     Social History Social History   Tobacco Use   Smoking status: Never   Smokeless tobacco: Never  Vaping Use   Vaping Use: Never used  Substance Use Topics   Alcohol use: No    Alcohol/week: 0.0 standard drinks of alcohol   Drug use: No     Allergies   Patient has no known allergies.   Review of Systems Review of Systems Per HPI  Physical Exam Triage Vital Signs ED Triage Vitals  Enc Vitals Group     BP 01/06/23 0835 (!) 157/79     Pulse Rate 01/06/23 0835 81     Resp 01/06/23 0835 18     Temp 01/06/23 0835 97.8 F (36.6 C)     Temp Source 01/06/23 0835 Oral     SpO2 01/06/23 0835 94 %     Weight --      Height --      Head Circumference --      Peak Flow --      Pain Score 01/06/23 0837 9     Pain Loc --      Pain Edu? --      Excl. in GC? --    No data found.  Updated Vital Signs BP (!) 157/79 (BP Location: Right Arm)   Pulse 81   Temp 97.8 F (36.6 C) (Oral)   Resp 18   SpO2 94%   Visual Acuity Right Eye Distance:   Left Eye Distance:   Bilateral Distance:    Right Eye Near:   Left Eye Near:     Bilateral Near:     Physical Exam Vitals and nursing note reviewed.  Constitutional:      General: He is not in acute distress.    Appearance: Normal appearance.  HENT:     Head: Normocephalic.  Eyes:     Extraocular  Movements: Extraocular movements intact.     Pupils: Pupils are equal, round, and reactive to light.  Cardiovascular:     Rate and Rhythm: Normal rate and regular rhythm.     Pulses: Normal pulses.     Heart sounds: Normal heart sounds.  Pulmonary:     Effort: Pulmonary effort is normal. No respiratory distress.     Breath sounds: Normal breath sounds. No stridor. No wheezing, rhonchi or rales.  Abdominal:     General: Bowel sounds are normal.     Palpations: Abdomen is soft.     Tenderness: There is no abdominal tenderness.  Musculoskeletal:     Cervical back: Normal range of motion.     Right knee: Swelling and erythema present. Normal range of motion. Tenderness present over the medial joint line. Normal pulse.     Right foot: Swelling and tenderness present.     Comments: Erythema noted to the medial right knee and medial aspect of the right great toe extending to the first metatarsal..  Area is warm to palpation. TTP over inferior medial aspect of RT knee and over the first metatarsal of the right great toe. FROM active and passive   Lymphadenopathy:     Cervical: No cervical adenopathy.  Skin:    General: Skin is warm and dry.  Neurological:     General: No focal deficit present.     Mental Status: He is alert and oriented to person, place, and time.  Psychiatric:        Mood and Affect: Mood normal.        Behavior: Behavior normal.      UC Treatments / Results  Labs (all labs ordered are listed, but only abnormal results are displayed) Labs Reviewed - No data to display  EKG   Radiology No results found.  Procedures Procedures (including critical care time)  Medications Ordered in UC Medications  methylPREDNISolone sodium succinate  (SOLU-MEDROL) 125 mg/2 mL injection 80 mg (80 mg Intramuscular Given 01/06/23 0908)    Initial Impression / Assessment and Plan / UC Course  I have reviewed the triage vital signs and the nursing notes.  Pertinent labs & imaging results that were available during my care of the patient were reviewed by me and considered in my medical decision making (see chart for details).  Patient is well-appearing, he is in no acute distress, is mildly hypertensive, but vital signs are otherwise stable.  Patient with known history of gout.  Suspect gout flares of the right knee and in the right great toe.  Solu-Medrol 80 mg intramuscularly was administered in the clinic.  Patient does have colchicine that he was advised to continue taking.  Did not administer Toradol based on patient's last available creatinine results in April 2023.  Advised patient that he should follow-up with his primary care to evaluate effect of his kidneys with use of the colchicine.  Advised to continue colchicine.  Supportive care recommendations were provided to the patient with strict ER precautions.  Patient verbalizes understanding.  All questions were answered.  Patient stable for discharge.   Final Clinical Impressions(s) / UC Diagnoses   Final diagnoses:  Acute gout of multiple sites, unspecified cause     Discharge Instructions      Continue the allopurinol that you are currently taking.  Recommend following up with your primary care physician to have your kidney function checked with use of the colchicine.  Based on your most recent lab work seen from April,  kidney function was slightly elevated and colchicine can worsen this result. Continue to monitor your diet recommending a low purine eating plan.  Information has been provided to you today. Go to the emergency department if you experience worsening pain, redness, become unable to walk, or have other concerns. Recommend following up with your primary care physician  for reevaluation of your gout within the next 2 to 4 weeks. Follow-up as needed.     ED Prescriptions   None    PDMP not reviewed this encounter.   Abran Cantor, NP 01/06/23 [phone]

## 2023-01-06 NOTE — ED Triage Notes (Signed)
Pt reports pain in right knee x 1 day. Reports pain is related to Gout. Allopurinol gives no relief.

## 2023-02-28 ENCOUNTER — Encounter: Payer: Self-pay | Admitting: Radiology

## 2023-03-24 ENCOUNTER — Telehealth: Payer: Self-pay | Admitting: Orthopaedic Surgery

## 2023-04-19 ENCOUNTER — Other Ambulatory Visit: Payer: Self-pay

## 2023-04-19 ENCOUNTER — Ambulatory Visit (INDEPENDENT_AMBULATORY_CARE_PROVIDER_SITE_OTHER): Payer: Medicare Other

## 2023-04-19 ENCOUNTER — Ambulatory Visit
Admission: EM | Admit: 2023-04-19 | Discharge: 2023-04-19 | Disposition: A | Discharge status: Home / Self Care | Payer: Medicare Other | Attending: Physician Assistant | Admitting: Physician Assistant

## 2023-04-19 DIAGNOSIS — R42 Dizziness and giddiness: Secondary | ICD-10-CM

## 2023-04-19 DIAGNOSIS — R0602 Shortness of breath: Secondary | ICD-10-CM

## 2023-04-19 DIAGNOSIS — R531 Weakness: Secondary | ICD-10-CM

## 2023-04-19 LAB — POCT URINALYSIS DIP (MANUAL ENTRY)
Blood, UA: NEGATIVE
Glucose, UA: NEGATIVE mg/dL
Leukocytes, UA: NEGATIVE
Nitrite, UA: NEGATIVE
Protein Ur, POC: 100 mg/dL — AB
Spec Grav, UA: >=1.03 — AB (ref 1.010–1.025)
Urobilinogen, UA: 0.2 E.U./dL
pH, UA: 5.5 (ref 5.0–8.0)

## 2023-04-19 LAB — POCT FASTING CBG KUC MANUAL ENTRY: POCT Glucose (KUC): 153 mg/dL — AB (ref 70–99)
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
## 2023-04-19 NOTE — Discharge Instructions (Signed)
Your EKG and x-ray were reassuring.  You did have some evidence of dehydration on your urine.  It is very important that you rest and drink lots of fluid.  This could be contributing to your symptoms.  We will contact you if your lab work is abnormal.  Please follow-up with your primary care next week.  If anything worsens and you have worsening lightheadedness, passing out, chest pain, shortness of breath, weakness you need to go to the emergency room immediately.
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
## 2023-04-20 LAB — CBC WITH DIFFERENTIAL/PLATELET
Basophils Absolute: 0.1 10*3/uL (ref 0.0–0.2)
Basos: 1 %
EOS (ABSOLUTE): 0.4 10*3/uL (ref 0.0–0.4)
Eos: 4 %
Hematocrit: 41.7 % (ref 37.5–51.0)
Hemoglobin: 13.9 g/dL (ref 13.0–17.7)
Immature Grans (Abs): 0.1 10*3/uL (ref 0.0–0.1)
Immature Granulocytes: 1 %
Lymphocytes Absolute: 2.1 10*3/uL (ref 0.7–3.1)
Lymphs: 23 %
MCH: 27.3 pg (ref 26.6–33.0)
MCHC: 33.3 g/dL (ref 31.5–35.7)
MCV: 82 fL (ref 79–97)
Monocytes Absolute: 0.6 10*3/uL (ref 0.1–0.9)
Monocytes: 7 %
Neutrophils Absolute: 5.8 10*3/uL (ref 1.4–7.0)
Neutrophils: 64 %
Platelets: 222 10*3/uL (ref 150–450)
RBC: 5.1 x10E6/uL (ref 4.14–5.80)
RDW: 14.6 % (ref 11.6–15.4)
WBC: 9.1 10*3/uL (ref 3.4–10.8)

## 2023-04-20 LAB — COMPREHENSIVE METABOLIC PANEL
ALT: 44 IU/L (ref 0–44)
AST: 40 IU/L (ref 0–40)
Albumin/Globulin Ratio: 1.7 (ref 1.2–2.2)
Albumin: 4.2 g/dL (ref 3.8–4.8)
Alkaline Phosphatase: 94 IU/L (ref 44–121)
BUN/Creatinine Ratio: 11 (ref 10–24)
BUN: 14 mg/dL (ref 8–27)
Bilirubin Total: 0.2 mg/dL (ref 0.0–1.2)
CO2: 19 mmol/L — ABNORMAL LOW (ref 20–29)
Calcium: 9.1 mg/dL (ref 8.6–10.2)
Chloride: 104 mmol/L (ref 96–106)
Creatinine, Ser: 1.28 mg/dL — ABNORMAL HIGH (ref 0.76–1.27)
Globulin, Total: 2.5 g/dL (ref 1.5–4.5)
Glucose: 89 mg/dL (ref 70–99)
Potassium: 4.2 mmol/L (ref 3.5–5.2)
Sodium: 145 mmol/L — ABNORMAL HIGH (ref 134–144)
Total Protein: 6.7 g/dL (ref 6.0–8.5)
eGFR: 59 mL/min/{1.73_m2} — ABNORMAL LOW (ref >59)

## 2023-09-21 ENCOUNTER — Telehealth: Payer: Self-pay | Admitting: Orthopaedic Surgery

## 2023-11-06 ENCOUNTER — Encounter: Payer: Self-pay | Admitting: Urology

## 2023-11-06 ENCOUNTER — Ambulatory Visit: Payer: Medicare Other | Admitting: Urology

## 2023-11-06 VITALS — BP 158/81 | HR 67

## 2023-11-06 DIAGNOSIS — N401 Enlarged prostate with lower urinary tract symptoms: Secondary | ICD-10-CM | POA: Diagnosis not present

## 2023-11-06 DIAGNOSIS — R351 Nocturia: Secondary | ICD-10-CM | POA: Diagnosis not present

## 2023-11-06 DIAGNOSIS — R972 Elevated prostate specific antigen [PSA]: Secondary | ICD-10-CM

## 2023-11-06 LAB — MICROSCOPIC EXAMINATION: Bacteria, UA: NONE SEEN

## 2023-11-06 LAB — URINALYSIS, ROUTINE W REFLEX MICROSCOPIC
Bilirubin, UA: NEGATIVE
Glucose, UA: NEGATIVE
Ketones, UA: NEGATIVE
Leukocytes,UA: NEGATIVE
Nitrite, UA: NEGATIVE
Specific Gravity, UA: 1.03 (ref 1.005–1.030)
Urobilinogen, Ur: 0.2 mg/dL (ref 0.2–1.0)
pH, UA: 6 (ref 5.0–7.5)

## 2023-11-06 MED ORDER — SOLIFENACIN SUCCINATE 5 MG PO TABS
5.0000 mg | ORAL_TABLET | Freq: Every day | ORAL | 11 refills | Status: AC
Start: 2023-11-06 — End: ?

## 2023-11-06 NOTE — Progress Notes (Signed)
11/06/2023 10:56 AM   Cesar Harris August 16, 1951 [id number]  Referring provider: Elfredia Nevins, MD [address] [place],  [place] [address]  Followup BPH and elevated PSA   HPI: Mr Cesar Harris is a 72yo here for followup for BPH and elevated PSA. Last PSA was stable at 6.4 which was a year ago. No recent PSA. IPSS 12 QOL 2 on rapaflo 8mg  daily. Nocturia 3x. He stopped the rapaflo for 1 week and did not notice a change in his nocturia. He previously tried mirabegron which failed to improve his nocturia.    PMH: Past Medical History:  Diagnosis Date   Barrett esophagus    COVID    Gout    History of kidney stones    Hypertension     Surgical History: Past Surgical History:  Procedure Laterality Date   BACK SURGERY  1998   l4 anf l5 herniated disc   BIOPSY  08/08/2021   Procedure: BIOPSY;  Surgeon: Lanelle Bal, DO;  [facility];  Service: Endoscopy;;   CATARACT EXTRACTION W/PHACO  04/03/2012   Procedure: CATARACT EXTRACTION PHACO AND INTRAOCULAR LENS PLACEMENT (IOC);  Surgeon: Gemma Payor, MD;  [facility];  Service: Ophthalmology;  Laterality: Left;  CDE: 8.94   CATARACT EXTRACTION W/PHACO Right 11/14/2015   Procedure: CATARACT EXTRACTION PHACO AND INTRAOCULAR LENS PLACEMENT RIGHT EYE;  Surgeon: Gemma Payor, MD;  Location: [facility];  Service: Ophthalmology;  Laterality: Right;  CDE:8.19   COLONOSCOPY N/A 04/19/2016   Procedure: COLONOSCOPY;  Surgeon: Corbin Ade, MD;  [facility];  Service: Endoscopy;  Laterality: N/A;  200 - moved to 1:15 - office to notify   COLONOSCOPY WITH PROPOFOL N/A 08/08/2021   Procedure: COLONOSCOPY WITH PROPOFOL;  Surgeon: Lanelle Bal, DO;  [facility];  Service: Endoscopy;  Laterality: N/A;  7:30am   ESOPHAGOGASTRODUODENOSCOPY N/A 04/19/2016   Procedure: ESOPHAGOGASTRODUODENOSCOPY (EGD);  Surgeon: Corbin Ade, MD;  [facility];  Service: Endoscopy;  Laterality: N/A;    ESOPHAGOGASTRODUODENOSCOPY (EGD) WITH PROPOFOL N/A 08/08/2021   Procedure: ESOPHAGOGASTRODUODENOSCOPY (EGD) WITH PROPOFOL;  Surgeon: Lanelle Bal, DO;  [facility];  Service: Endoscopy;  Laterality: N/A;   HERNIA REPAIR     left and right inguinal- [place] [facility]   MALONEY DILATION N/A 04/19/2016   Procedure: Elease Hashimoto DILATION;  Surgeon: Corbin Ade, MD;  [facility];  Service: Endoscopy;  Laterality: N/A;    Home Medications:  Allergies as of 11/06/2023   No Known Allergies      Medication List        Accurate as of November 06, 2023 10:56 AM. If you have any questions, ask your nurse or doctor.          acetaminophen 325 MG tablet Commonly known as: Tylenol Take 2 tablets (650 mg total) by mouth every 6 (six) hours as needed for moderate pain.   allopurinol 300 MG tablet Commonly known as: ZYLOPRIM TAKE 1 TABLET BY MOUTH EVERY DAY   amLODipine-benazepril 5-10 MG capsule Commonly known as: LOTREL Take 1 capsule by mouth daily.   mirabegron ER 25 MG Tb24 tablet Commonly known as: MYRBETRIQ Take 1 tablet (25 mg total) by mouth daily.   pantoprazole 40 MG tablet Commonly known as: PROTONIX TAKE 1 TABLET BY MOUTH EVERY DAY   polyethylene glycol-electrolytes 420 g solution Commonly known as: TriLyte Take 4,000 mLs by mouth as directed.   predniSONE 50 MG tablet Commonly known as: DELTASONE  Take 1 tablet daily with breakfast for 5 days.   SAW PALMETTO (SERENOA REPENS) PO Take 900 mg by mouth 2 (two) times daily.   silodosin 8 MG Caps capsule Commonly known as: RAPAFLO Take 1 capsule (8 mg total) by mouth daily with breakfast.   tiZANidine 4 MG tablet Commonly known as: Zanaflex Take 1 tablet (4 mg total) by mouth at bedtime.        Allergies: No Known Allergies  Family History: Family History  Problem Relation Age of Onset   Anesthesia problems Neg Hx    Hypotension Neg Hx    Malignant hyperthermia Neg Hx    Pseudochol  deficiency Neg Hx    Colon cancer Neg Hx     Social History:  reports that he has never smoked. He has never used smokeless tobacco. He reports that he does not drink alcohol and does not use drugs.  ROS: All other review of systems were reviewed and are negative except what is noted above in HPI  Physical Exam: BP (!) 158/81   Pulse 67   Constitutional:  Alert and oriented, No acute distress. HEENT: [place] AT, moist mucus membranes.  Trachea midline, no masses. Cardiovascular: No clubbing, cyanosis, or edema. Respiratory: Normal respiratory effort, no increased work of breathing. GI: Abdomen is soft, nontender, nondistended, no abdominal masses GU: No CVA tenderness.  Lymph: No cervical or inguinal lymphadenopathy. Skin: No rashes, bruises or suspicious lesions. Neurologic: Grossly intact, no focal deficits, moving all 4 extremities. Psychiatric: Normal mood and affect.  Laboratory Data: Lab Results  Component Value Date   WBC 9.1 04/19/2023   HGB 13.9 04/19/2023   HCT 41.7 04/19/2023   MCV 82 04/19/2023   PLT 222 04/19/2023    Lab Results  Component Value Date   CREATININE 1.28 (H) 04/19/2023    No results found for: "PSA"  No results found for: "TESTOSTERONE"  No results found for: "HGBA1C"  Urinalysis    Component Value Date/Time   APPEARANCEUR Clear 11/07/2022 1034   GLUCOSEU Negative 11/07/2022 1034   BILIRUBINUR small (A) 04/19/2023 1602   BILIRUBINUR Negative 11/07/2022 1034   KETONESUR trace (5) (A) 04/19/2023 1602   PROTEINUR =100 (A) 04/19/2023 1602   PROTEINUR 2+ (A) 11/07/2022 1034   UROBILINOGEN 0.2 04/19/2023 1602   NITRITE Negative 04/19/2023 1602   NITRITE Negative 11/07/2022 1034   LEUKOCYTESUR Negative 04/19/2023 1602   LEUKOCYTESUR Negative 11/07/2022 1034    Lab Results  Component Value Date   LABMICR See below: 11/07/2022   WBCUA 0-5 11/07/2022   LABEPIT 0-10 11/07/2022   MUCUS Present 11/08/2021   BACTERIA None seen 11/07/2022     Pertinent Imaging: *** No results found for this or any previous visit.  No results found for this or any previous visit.  No results found for this or any previous visit.  No results found for this or any previous visit.  No results found for this or any previous visit.  No valid procedures specified. No results found for this or any previous visit.  No results found for this or any previous visit.   Assessment & Plan:    1. Elevated PSA PSa today. If his PSA remains stable I will see him back in 1 year with PSA - Urinalysis, Routine w reflex microscopic - PSA; Future  2. Nocturia We will trial vesicare 5mg  daily  3. Benign localized prostatic hyperplasia with lower urinary tract symptoms (LUTS) Continue rapaflo 8mg  daily   No follow-ups on file.  Wilkie Aye, MD  [facility] [place]

## 2023-11-06 NOTE — Patient Instructions (Signed)

## 2023-11-07 LAB — PSA: Prostate Specific Ag, Serum: 6.9 ng/mL — ABNORMAL HIGH (ref 0.0–4.0)

## 2023-12-11 ENCOUNTER — Other Ambulatory Visit: Payer: Self-pay | Admitting: Urology

## 2023-12-11 DIAGNOSIS — N401 Enlarged prostate with lower urinary tract symptoms: Secondary | ICD-10-CM

## 2023-12-11 DIAGNOSIS — R351 Nocturia: Secondary | ICD-10-CM

## 2024-01-04 ENCOUNTER — Encounter: Payer: Self-pay | Admitting: Nurse Practitioner

## 2024-01-04 ENCOUNTER — Ambulatory Visit
Admission: EM | Admit: 2024-01-04 | Discharge: 2024-01-04 | Disposition: A | Discharge status: Home / Self Care | Payer: Medicare Other | Attending: Nurse Practitioner | Admitting: Nurse Practitioner

## 2024-01-04 DIAGNOSIS — J069 Acute upper respiratory infection, unspecified: Secondary | ICD-10-CM | POA: Diagnosis not present

## 2024-01-04 DIAGNOSIS — Z1152 Encounter for screening for COVID-19: Secondary | ICD-10-CM | POA: Diagnosis not present

## 2024-01-04 MED ORDER — CETIRIZINE HCL 10 MG PO TABS: 10.0000 mg | ORAL_TABLET | Freq: Every day | ORAL | 0 refills | Status: AC

## 2024-01-04 MED ORDER — FLUTICASONE PROPIONATE 50 MCG/ACT NA SUSP: 2.0000 | Freq: Every day | NASAL | 0 refills | Status: AC

## 2024-01-04 MED ORDER — PROMETHAZINE-DM 6.25-15 MG/5ML PO SYRP: 5.0000 mL | ORAL_SOLUTION | Freq: Four times a day (QID) | ORAL | 0 refills | Status: AC | PRN

## 2024-01-04 NOTE — ED Triage Notes (Signed)
 Pt presents to the office for cough x 2 weeks. Patient reports nasal congestion. Cough is worse at night. Patient is using OTC cough supplements.

## 2024-01-04 NOTE — Discharge Instructions (Signed)
 COVID test is pending. You will be contacted if the results are positive. You will also have access to your results via MyChart. Take medication as prescribed. Increase fluids and allow for plenty of rest. Recommend Tylenol  as needed for pain, fever, or general discomfort. Recommend using a humidifier at bedtime during sleep to help with cough and nasal congestion. Sleep elevated on 2 pillows. If you develop new symptoms such as fever,wheezing, difficulty breathing or worsening cough, you may follow-up in this clinic or with your PCP.  Follow-up as needed.

## 2024-01-04 NOTE — ED Provider Notes (Signed)
 [facility]    CSN: [id number] Arrival date & time: 01/04/24  9188      History   Chief Complaint Chief Complaint  Patient presents with   Cough    HPI Cesar Harris is a 73 y.o. male.   The history is provided by the patient.   Patient presents for complaints of cough and nasal congestion that started over the past several days.  Patient states initially when symptoms started, he took over-the-counter medication, symptoms seem to improve, but over the past 24 hours, the cough returned.  He states that the cough has kept him awake during the night.  Denies fever, chills, headache, ear pain, wheezing, difficulty breathing, shortness of breath, abdominal pain, nausea, vomiting, diarrhea, or rash.    Past Medical History:  Diagnosis Date   Barrett esophagus    COVID    Gout    History of kidney stones    Hypertension     Patient Active Problem List   Diagnosis Date Noted   Elevated PSA 11/07/2020   Benign localized prostatic hyperplasia with lower urinary tract symptoms (LUTS) 11/07/2020   Nocturia 11/07/2020   Barrett's esophagus 06/11/2016   History of colonic polyps    Diverticulosis of colon without hemorrhage    Mucosal abnormality of esophagus    History of adenomatous polyp of colon 04/09/2016   Dysphagia 04/09/2016    Past Surgical History:  Procedure Laterality Date   BACK SURGERY  1998   l4 anf l5 herniated disc   BIOPSY  08/08/2021   Procedure: BIOPSY;  Surgeon: Cindie Carlin POUR, DO;  Location: [facility];  Service: Endoscopy;;   CATARACT EXTRACTION W/PHACO  04/03/2012   Procedure: CATARACT EXTRACTION PHACO AND INTRAOCULAR LENS PLACEMENT (IOC);  Surgeon: Cherene Mania, MD;  Location: [facility];  Service: Ophthalmology;  Laterality: Left;  CDE: 8.94   CATARACT EXTRACTION W/PHACO Right 11/14/2015   Procedure: CATARACT EXTRACTION PHACO AND INTRAOCULAR LENS PLACEMENT RIGHT EYE;  Surgeon: Cherene Mania, MD;  Location: [facility];  Service: Ophthalmology;   Laterality: Right;  CDE:8.19   COLONOSCOPY N/A 04/19/2016   Procedure: COLONOSCOPY;  Surgeon: Lamar CHRISTELLA Hollingshead, MD;  Location: [facility];  Service: Endoscopy;  Laterality: N/A;  200 - moved to 1:15 - office to notify   COLONOSCOPY WITH PROPOFOL  N/A 08/08/2021   Procedure: COLONOSCOPY WITH PROPOFOL ;  Surgeon: Cindie Carlin POUR, DO;  Location: [facility];  Service: Endoscopy;  Laterality: N/A;  7:30am   ESOPHAGOGASTRODUODENOSCOPY N/A 04/19/2016   Procedure: ESOPHAGOGASTRODUODENOSCOPY (EGD);  Surgeon: Lamar CHRISTELLA Hollingshead, MD;  Location: [facility];  Service: Endoscopy;  Laterality: N/A;   ESOPHAGOGASTRODUODENOSCOPY (EGD) WITH PROPOFOL  N/A 08/08/2021   Procedure: ESOPHAGOGASTRODUODENOSCOPY (EGD) WITH PROPOFOL ;  Surgeon: Cindie Carlin POUR, DO;  Location: [facility];  Service: Endoscopy;  Laterality: N/A;   HERNIA REPAIR     left and right inguinal- Bryna Lien   MALONEY DILATION N/A 04/19/2016   Procedure: AGAPITO DILATION;  Surgeon: Lamar CHRISTELLA Hollingshead, MD;  Location: [facility];  Service: Endoscopy;  Laterality: N/A;       Home Medications    Prior to Admission medications   Medication Sig Start Date End Date Taking? Authorizing Provider  allopurinol  (ZYLOPRIM ) 300 MG tablet TAKE 1 TABLET BY MOUTH EVERY DAY 09/24/23  Yes Brenna Lin, MD  amLODipine-benazepril (LOTREL) 5-10 MG per capsule Take 1 capsule by mouth daily.   Yes [provider]  cetirizine  (ZYRTEC ) 10 MG tablet Take 1 tablet (10 mg  total) by mouth daily. 01/04/24  Yes Leath-Warren, Etta PARAS, NP  fluticasone  (FLONASE ) 50 MCG/ACT nasal spray Place 2 sprays into both nostrils daily. 01/04/24  Yes Leath-Warren, Etta PARAS, NP  pantoprazole  (PROTONIX ) 40 MG tablet TAKE 1 TABLET BY MOUTH EVERY DAY 09/29/18  Yes Shirlean Therisa ORN, NP  polyethylene glycol-electrolytes (TRILYTE) 420 g solution Take 4,000 mLs by mouth as directed. 07/06/21  Yes Carver, Charles K, DO  promethazine -dextromethorphan (PROMETHAZINE -DM) 6.25-15 MG/5ML syrup Take  5 mLs by mouth 4 (four) times daily as needed. 01/04/24  Yes Leath-Warren, Etta PARAS, NP  SAW PALMETTO, SERENOA REPENS, PO Take 900 mg by mouth 2 (two) times daily.   Yes [provider]  silodosin  (RAPAFLO ) 8 MG CAPS capsule TAKE 1 CAPSULE BY MOUTH DAILY WITH BREAKFAST. 12/16/23  Yes McKenzie, Belvie CROME, MD  solifenacin  (VESICARE ) 5 MG tablet Take 1 tablet (5 mg total) by mouth daily. 11/06/23  Yes McKenzie, Belvie CROME, MD  acetaminophen  (TYLENOL ) 325 MG tablet Take 2 tablets (650 mg total) by mouth every 6 (six) hours as needed for moderate pain. 04/13/22   Christopher Savannah, PA-C  predniSONE  (DELTASONE ) 50 MG tablet Take 1 tablet daily with breakfast for 5 days. 07/16/22   Leath-Warren, Etta PARAS, NP  tiZANidine  (ZANAFLEX ) 4 MG tablet Take 1 tablet (4 mg total) by mouth at bedtime. 04/13/22   Christopher Savannah, PA-C    Family History Family History  Problem Relation Age of Onset   Anesthesia problems Neg Hx    Hypotension Neg Hx    Malignant hyperthermia Neg Hx    Pseudochol deficiency Neg Hx    Colon cancer Neg Hx     Social History Social History   Tobacco Use   Smoking status: Never   Smokeless tobacco: Never  Vaping Use   Vaping status: Never Used  Substance Use Topics   Alcohol use: No    Alcohol/week: 0.0 standard drinks of alcohol   Drug use: No     Allergies   Patient has no known allergies.   Review of Systems Review of Systems Per HPI  Physical Exam Triage Vital Signs ED Triage Vitals  Encounter Vitals Group     BP 01/04/24 0821 (!) 158/79     Systolic BP Percentile --      Diastolic BP Percentile --      Pulse Rate 01/04/24 0821 66     Resp 01/04/24 0821 16     Temp 01/04/24 0821 98.2 F (36.8 C)     Temp Source 01/04/24 0821 Oral     SpO2 01/04/24 0821 94 %     Weight --      Height --      Head Circumference --      Peak Flow --      Pain Score 01/04/24 0824 0     Pain Loc --      Pain Education --      Exclude from Growth Chart --    No data  found.  Updated Vital Signs BP (!) 158/79 (BP Location: Left Arm)   Pulse 66   Temp 98.2 F (36.8 C) (Oral)   Resp 16   SpO2 94%   Visual Acuity Right Eye Distance:   Left Eye Distance:   Bilateral Distance:    Right Eye Near:   Left Eye Near:    Bilateral Near:     Physical Exam Vitals and nursing note reviewed.  Constitutional:      General: He is not  in acute distress.    Appearance: Normal appearance.  HENT:     Head: Normocephalic.     Right Ear: Tympanic membrane, ear canal and external ear normal.     Left Ear: Tympanic membrane, ear canal and external ear normal.     Nose: Congestion present.     Mouth/Throat:     Mouth: Mucous membranes are moist.     Pharynx: No oropharyngeal exudate or posterior oropharyngeal erythema.     Comments: Cobblestoning present to posterior oropharynx  Eyes:     Extraocular Movements: Extraocular movements intact.     Conjunctiva/sclera: Conjunctivae normal.     Pupils: Pupils are equal, round, and reactive to light.  Cardiovascular:     Rate and Rhythm: Normal rate and regular rhythm.     Pulses: Normal pulses.     Heart sounds: Normal heart sounds.  Pulmonary:     Effort: Pulmonary effort is normal. No respiratory distress.     Breath sounds: Normal breath sounds. No stridor. No wheezing, rhonchi or rales.  Abdominal:     General: Bowel sounds are normal.     Palpations: Abdomen is soft.     Tenderness: There is no abdominal tenderness.  Musculoskeletal:     Cervical back: Normal range of motion.  Skin:    General: Skin is warm and dry.  Neurological:     General: No focal deficit present.     Mental Status: He is alert and oriented to person, place, and time.  Psychiatric:        Mood and Affect: Mood normal.        Behavior: Behavior normal.      UC Treatments / Results  Labs (all labs ordered are listed, but only abnormal results are displayed) Labs Reviewed  SARS CORONAVIRUS 2 (TAT 6-24 HRS)     EKG   Radiology No results found.  Procedures Procedures (including critical care time)  Medications Ordered in UC Medications - No data to display  Initial Impression / Assessment and Plan / UC Course  I have reviewed the triage vital signs and the nursing notes.  Pertinent labs & imaging results that were available during my care of the patient were reviewed by me and considered in my medical decision making (see chart for details).  On exam, lung sounds are clear throughout, room air sats at 94%.  COVID test is pending.  Suspect patient may be experiencing a viral upper respiratory infection with cough.  Symptomatic treatment provided with Promethazine  DM for cough, fluticasone  50 mcg nasal spray for nasal congestion, and cetirizine  10 mg and as an antihistamine.  Supportive care recommendations were provided and discussed with the patient to include fluids, rest, over-the-counter analgesics, and use of a humidifier at nighttime during sleep.  Discussed indications for follow-up.  Patient was in agreement with this plan of care and verbalizes understanding.  All questions were answered.  Patient stable for discharge.  Final Clinical Impressions(s) / UC Diagnoses   Final diagnoses:  Viral upper respiratory tract infection with cough  Encounter for screening for COVID-19     Discharge Instructions      COVID test is pending. You will be contacted if the results are positive. You will also have access to your results via MyChart. Take medication as prescribed. Increase fluids and allow for plenty of rest. Recommend Tylenol  as needed for pain, fever, or general discomfort. Recommend using a humidifier at bedtime during sleep to help with cough and nasal congestion.  Sleep elevated on 2 pillows. If you develop new symptoms such as fever,wheezing, difficulty breathing or worsening cough, you may follow-up in this clinic or with your PCP.  Follow-up as needed.      ED  Prescriptions     Medication Sig Dispense Auth. Provider   cetirizine  (ZYRTEC ) 10 MG tablet Take 1 tablet (10 mg total) by mouth daily. 30 tablet Leath-Warren, Etta PARAS, NP   promethazine -dextromethorphan (PROMETHAZINE -DM) 6.25-15 MG/5ML syrup Take 5 mLs by mouth 4 (four) times daily as needed. 118 mL Leath-Warren, Etta PARAS, NP   fluticasone  (FLONASE ) 50 MCG/ACT nasal spray Place 2 sprays into both nostrils daily. 16 g Leath-Warren, Etta PARAS, NP      PDMP not reviewed this encounter.   Gilmer Etta PARAS, NP 01/04/24 [phone]

## 2024-01-05 LAB — SARS CORONAVIRUS 2 (TAT 6-24 HRS): SARS Coronavirus 2: NEGATIVE

## 2024-03-24 ENCOUNTER — Telehealth: Payer: Self-pay | Admitting: Orthopaedic Surgery

## 2024-06-01 ENCOUNTER — Ambulatory Visit
Admission: EM | Admit: 2024-06-01 | Discharge: 2024-06-01 | Disposition: A | Discharge status: Home / Self Care | Attending: Nurse Practitioner | Admitting: Nurse Practitioner

## 2024-06-01 DIAGNOSIS — S81812A Laceration without foreign body, left lower leg, initial encounter: Secondary | ICD-10-CM

## 2024-06-01 DIAGNOSIS — Z23 Encounter for immunization: Secondary | ICD-10-CM | POA: Diagnosis not present

## 2024-06-01 MED ORDER — MUPIROCIN 2 % EX OINT: 1.0000 | TOPICAL_OINTMENT | Freq: Two times a day (BID) | CUTANEOUS | 0 refills | Status: AC

## 2024-06-01 MED ORDER — TETANUS-DIPHTH-ACELL PERTUSSIS 5-2.5-18.5 LF-MCG/0.5 IM SUSY
0.5000 mL | PREFILLED_SYRINGE | Freq: Once | INTRAMUSCULAR | Status: AC
  Administered 2024-06-01: 0.5 mL via INTRAMUSCULAR

## 2024-06-01 NOTE — Discharge Instructions (Addendum)
 Tetanus shot was updated today.  We did not put any stitches in your cut today because of risk of infection with how long ago the cut happened.  Please perform the wound care as described below and return sooner if signs or symptoms of infection develop.  Wound care twice daily: clean with soap and water , apply mupirocin ointment, cover with nonadherent gauze and then Kerlix gauze and secure with tape.  Do not tape your skin to prevent further trauma to your skin.

## 2024-06-01 NOTE — ED Provider Notes (Signed)
 [facility]    CSN: [id number] Arrival date & time: 06/01/24  1445      History   Chief Complaint Chief Complaint  Patient presents with   Laceration    HPI Cesar Harris is a 73 y.o. male.   Patient presents today with cut to left lower leg.  Reports last night around 630-7pm he was unloading a lawn mower from the back of a trailer when he lost control of the lawnmower and a bolt on the side of the lawnmower cut his skin.  He is worried because the area has been bleeding.  He has covered the area with a bandaid and tried cleaning the area but is concerned because it continues to bleed.  No fever or nausea/vomiting.  No new swelling in the leg. He does not take blood thinners.    Past Medical History:  Diagnosis Date   Barrett esophagus    COVID    Gout    History of kidney stones    Hypertension     Patient Active Problem List   Diagnosis Date Noted   Elevated PSA 11/07/2020   Benign localized prostatic hyperplasia with lower urinary tract symptoms (LUTS) 11/07/2020   Nocturia 11/07/2020   Barrett's esophagus 06/11/2016   History of colonic polyps    Diverticulosis of colon without hemorrhage    Mucosal abnormality of esophagus    History of adenomatous polyp of colon 04/09/2016   Dysphagia 04/09/2016    Past Surgical History:  Procedure Laterality Date   BACK SURGERY  1998   l4 anf l5 herniated disc   BIOPSY  08/08/2021   Procedure: BIOPSY;  Surgeon: Vinetta Greening, DO;  Location: [facility];  Service: Endoscopy;;   CATARACT EXTRACTION W/PHACO  04/03/2012   Procedure: CATARACT EXTRACTION PHACO AND INTRAOCULAR LENS PLACEMENT (IOC);  Surgeon: Anner Kill, MD;  Location: [facility];  Service: Ophthalmology;  Laterality: Left;  CDE: 8.94   CATARACT EXTRACTION W/PHACO Right 11/14/2015   Procedure: CATARACT EXTRACTION PHACO AND INTRAOCULAR LENS PLACEMENT RIGHT EYE;  Surgeon: Anner Kill, MD;  Location: [facility];  Service: Ophthalmology;  Laterality: Right;   CDE:8.19   COLONOSCOPY N/A 04/19/2016   Procedure: COLONOSCOPY;  Surgeon: Suzette Espy, MD;  Location: [facility];  Service: Endoscopy;  Laterality: N/A;  200 - moved to 1:15 - office to notify   COLONOSCOPY WITH PROPOFOL  N/A 08/08/2021   Procedure: COLONOSCOPY WITH PROPOFOL ;  Surgeon: Vinetta Greening, DO;  Location: [facility];  Service: Endoscopy;  Laterality: N/A;  7:30am   ESOPHAGOGASTRODUODENOSCOPY N/A 04/19/2016   Procedure: ESOPHAGOGASTRODUODENOSCOPY (EGD);  Surgeon: Suzette Espy, MD;  Location: [facility];  Service: Endoscopy;  Laterality: N/A;   ESOPHAGOGASTRODUODENOSCOPY (EGD) WITH PROPOFOL  N/A 08/08/2021   Procedure: ESOPHAGOGASTRODUODENOSCOPY (EGD) WITH PROPOFOL ;  Surgeon: Vinetta Greening, DO;  Location: [facility];  Service: Endoscopy;  Laterality: N/A;   HERNIA REPAIR     left and right inguinal- Leitha Purl   MALONEY DILATION N/A 04/19/2016   Procedure: Londa Rival DILATION;  Surgeon: Suzette Espy, MD;  Location: [facility];  Service: Endoscopy;  Laterality: N/A;       Home Medications    Prior to Admission medications   Medication Sig Start Date End Date Taking? Authorizing Provider  allopurinol  (ZYLOPRIM ) 300 MG tablet TAKE 1 TABLET BY MOUTH EVERY DAY 03/24/24  Yes Pleasant Brilliant, MD  amLODipine-benazepril (LOTREL) 5-10 MG per capsule Take 1 capsule by mouth daily.   Yes  [provider]  mupirocin ointment (BACTROBAN) 2 % Apply 1 Application topically 2 (two) times daily for 10 days. 06/01/24 06/11/24 Yes Wilhemena Harbour, NP  pantoprazole  (PROTONIX ) 40 MG tablet TAKE 1 TABLET BY MOUTH EVERY DAY 09/29/18  Yes Delman Ferns, NP  SAW PALMETTO, SERENOA REPENS, PO Take 900 mg by mouth 2 (two) times daily.   Yes [provider]  acetaminophen  (TYLENOL ) 325 MG tablet Take 2 tablets (650 mg total) by mouth every 6 (six) hours as needed for moderate pain. 04/13/22   Adolph Hoop, PA-C  cetirizine  (ZYRTEC ) 10 MG tablet Take 1 tablet (10 mg total) by  mouth daily. 01/04/24   Leath-Warren, Belen Bowers, NP  fluticasone  (FLONASE ) 50 MCG/ACT nasal spray Place 2 sprays into both nostrils daily. 01/04/24   Leath-Warren, Belen Bowers, NP  polyethylene glycol-electrolytes (TRILYTE) 420 g solution Take 4,000 mLs by mouth as directed. 07/06/21   Carver, Charles K, DO  predniSONE  (DELTASONE ) 50 MG tablet Take 1 tablet daily with breakfast for 5 days. 07/16/22   Leath-Warren, Belen Bowers, NP  promethazine -dextromethorphan (PROMETHAZINE -DM) 6.25-15 MG/5ML syrup Take 5 mLs by mouth 4 (four) times daily as needed. 01/04/24   Leath-Warren, Belen Bowers, NP  silodosin  (RAPAFLO ) 8 MG CAPS capsule TAKE 1 CAPSULE BY MOUTH DAILY WITH BREAKFAST. 12/16/23   McKenzie, Arden Beck, MD  solifenacin  (VESICARE ) 5 MG tablet Take 1 tablet (5 mg total) by mouth daily. 11/06/23   McKenzie, Arden Beck, MD  tiZANidine  (ZANAFLEX ) 4 MG tablet Take 1 tablet (4 mg total) by mouth at bedtime. 04/13/22   Adolph Hoop, PA-C    Family History Family History  Problem Relation Age of Onset   Anesthesia problems Neg Hx    Hypotension Neg Hx    Malignant hyperthermia Neg Hx    Pseudochol deficiency Neg Hx    Colon cancer Neg Hx     Social History Social History   Tobacco Use   Smoking status: Never   Smokeless tobacco: Never  Vaping Use   Vaping status: Never Used  Substance Use Topics   Alcohol use: No    Alcohol/week: 0.0 standard drinks of alcohol   Drug use: No     Allergies   Patient has no known allergies.   Review of Systems Review of Systems Per HPI  Physical Exam Triage Vital Signs ED Triage Vitals [06/01/24 1517]  Encounter Vitals Group     BP (!) 165/79     Systolic BP Percentile      Diastolic BP Percentile      Pulse Rate 74     Resp 16     Temp 98.3 F (36.8 C)     Temp Source Oral     SpO2 95 %     Weight      Height      Head Circumference      Peak Flow      Pain Score 7     Pain Loc      Pain Education      Exclude from Growth Chart    No data  found.  Updated Vital Signs BP (!) 165/79 (BP Location: Right Arm)   Pulse 74   Temp 98.3 F (36.8 C) (Oral)   Resp 16   SpO2 95%   Visual Acuity Right Eye Distance:   Left Eye Distance:   Bilateral Distance:    Right Eye Near:   Left Eye Near:    Bilateral Near:     Physical  Exam Vitals and nursing note reviewed.  Constitutional:      General: He is not in acute distress.    Appearance: Normal appearance. He is not toxic-appearing.  HENT:     Head: Normocephalic and atraumatic.     Mouth/Throat:     Mouth: Mucous membranes are moist.  Pulmonary:     Effort: Pulmonary effort is normal. No respiratory distress.  Skin:    General: Skin is warm and dry.     Capillary Refill: Capillary refill takes less than 2 seconds.     Coloration: Skin is not jaundiced or pale.     Findings: Laceration present. No rash.     Comments: 1 cm linear laceration to left lower extremity in area marked; area is surrounding by a skin tear and there are multiple other small abrasions and skin tears to the skin around the laceration.  No active bleeding, bruising, or swelling.  Neurological:     Mental Status: He is alert and oriented to person, place, and time.  Psychiatric:        Behavior: Behavior is cooperative.      UC Treatments / Results  Labs (all labs ordered are listed, but only abnormal results are displayed) Labs Reviewed - No data to display  EKG   Radiology No results found.  Procedures Procedures (including critical care time)  Medications Ordered in UC Medications  Tdap (BOOSTRIX) injection 0.5 mL (0.5 mLs Intramuscular Given 06/01/24 1553)    Initial Impression / Assessment and Plan / UC Course  I have reviewed the triage vital signs and the nursing notes.  Pertinent labs & imaging results that were available during my care of the patient were reviewed by me and considered in my medical decision making (see chart for details).   Patient is mildly hypertensive  in triage today, otherwise vital signs are stable.  1. Laceration of left lower extremity, initial encounter No red flags Given length of time since laceration occurred, recommended against wound closure today and patient in agreement Wound care discussed at length with patient; clean twice daily with soap and water , apply mupirocin ointment and cover  There appears to be skin tears around the wound due to adhesive dressings; recommended using nonadherent gauze covered with woven gauze and tape; do not apply any tape to the skin Return for signs or symptoms of infection in the wound Tdap updated today  The patient was given the opportunity to ask questions.  All questions answered to their satisfaction.  The patient is in agreement to this plan.   Final Clinical Impressions(s) / UC Diagnoses   Final diagnoses:  Laceration of left lower extremity, initial encounter     Discharge Instructions      Tetanus shot was updated today.  We did not put any stitches in your cut today because of risk of infection with how long ago the cut happened.  Please perform the wound care as described below and return sooner if signs or symptoms of infection develop.  Wound care twice daily: clean with soap and water , apply mupirocin ointment, cover with nonadherent gauze and then Kerlix gauze and secure with tape.  Do not tape your skin to prevent further trauma to your skin.  ED Prescriptions     Medication Sig Dispense Auth. Provider   mupirocin ointment (BACTROBAN) 2 % Apply 1 Application topically 2 (two) times daily for 10 days. 22 g Wilhemena Harbour, NP      PDMP not reviewed this encounter.  Wilhemena Harbour, NP 06/01/24 1630

## 2024-06-01 NOTE — ED Triage Notes (Signed)
 Pt states he cut his left ankle/leg on his lawn mower frame, thinks it may have been cut by a bolt sticking out on the frame last night at 7 pm.   Pt does not remember his last tetanus shot but would like to have it updated today if needed.

## 2024-08-21 ENCOUNTER — Encounter: Payer: Self-pay | Admitting: Radiology

## 2024-09-20 ENCOUNTER — Telehealth: Payer: Self-pay | Admitting: Orthopaedic Surgery

## 2024-10-28 ENCOUNTER — Other Ambulatory Visit: Payer: Medicare Other

## 2024-10-28 DIAGNOSIS — R972 Elevated prostate specific antigen [PSA]: Secondary | ICD-10-CM

## 2024-10-29 LAB — PSA: Prostate Specific Ag, Serum: 8 ng/mL — ABNORMAL HIGH (ref 0.0–4.0)

## 2024-11-02 ENCOUNTER — Encounter: Payer: Self-pay | Admitting: Radiology

## 2024-11-04 ENCOUNTER — Ambulatory Visit: Payer: Medicare Other | Admitting: Urology

## 2024-11-04 VITALS — BP 166/78 | HR 73

## 2024-11-04 DIAGNOSIS — N401 Enlarged prostate with lower urinary tract symptoms: Secondary | ICD-10-CM

## 2024-11-04 DIAGNOSIS — R351 Nocturia: Secondary | ICD-10-CM

## 2024-11-04 DIAGNOSIS — R972 Elevated prostate specific antigen [PSA]: Secondary | ICD-10-CM | POA: Diagnosis not present

## 2024-11-04 LAB — MICROSCOPIC EXAMINATION: Bacteria, UA: NONE SEEN

## 2024-11-04 LAB — URINALYSIS, ROUTINE W REFLEX MICROSCOPIC
Bilirubin, UA: NEGATIVE
Glucose, UA: NEGATIVE
Ketones, UA: NEGATIVE
Nitrite, UA: NEGATIVE
Specific Gravity, UA: 1.015 (ref 1.005–1.030)
Urobilinogen, Ur: 0.2 mg/dL (ref 0.2–1.0)
pH, UA: 6 (ref 5.0–7.5)

## 2024-11-04 MED ORDER — SILODOSIN 8 MG PO CAPS
8.0000 mg | ORAL_CAPSULE | Freq: Every day | ORAL | 3 refills | Status: AC
Start: 2024-11-04 — End: ?

## 2024-11-04 NOTE — Progress Notes (Signed)
 11/04/2024 11:30 AM   Cesar Harris [birth year] [id number]  Referring provider: Bertell Satterfield, MD [address] [place],  [place] [address]  Followup elevated PSA   HPI: Cesar Harris is a 73yo here for followup for elevated PSa and BPh with nocturia. IPSS 12 QOl 2 off BPH medication. Urine stream is usually strong. He denies straining to urinate. Nocturia 1-2x. He has occasional urinary hesitancy. PSA increased to 8.0 from 6.4    PMH: Past Medical History:  Diagnosis Date   Barrett esophagus    COVID    Gout    History of kidney stones    Hypertension     Surgical History: Past Surgical History:  Procedure Laterality Date   BACK SURGERY  1998   l4 anf l5 herniated disc   BIOPSY  08/08/2021   Procedure: BIOPSY;  Surgeon: Cindie Carlin POUR, DO;  Location: [facility];  Service: Endoscopy;;   CATARACT EXTRACTION W/PHACO  04/03/2012   Procedure: CATARACT EXTRACTION PHACO AND INTRAOCULAR LENS PLACEMENT (IOC);  Surgeon: Cherene Mania, MD;  Location: [facility];  Service: Ophthalmology;  Laterality: Left;  CDE: 8.94   CATARACT EXTRACTION W/PHACO Right 11/14/2015   Procedure: CATARACT EXTRACTION PHACO AND INTRAOCULAR LENS PLACEMENT RIGHT EYE;  Surgeon: Cherene Mania, MD;  Location: [facility];  Service: Ophthalmology;  Laterality: Right;  CDE:8.19   COLONOSCOPY N/A 04/19/2016   Procedure: COLONOSCOPY;  Surgeon: Lamar CHRISTELLA Hollingshead, MD;  Location: [facility];  Service: Endoscopy;  Laterality: N/A;  200 - moved to 1:15 - office to notify   COLONOSCOPY WITH PROPOFOL  N/A 08/08/2021   Procedure: COLONOSCOPY WITH PROPOFOL ;  Surgeon: Cindie Carlin POUR, DO;  [facility]: [facility];  Service: Endoscopy;  Laterality: N/A;  7:30am   ESOPHAGOGASTRODUODENOSCOPY N/A 04/19/2016   Procedure: ESOPHAGOGASTRODUODENOSCOPY (EGD);  Surgeon: Lamar CHRISTELLA Hollingshead, MD;  [facility]: [facility];  Service: Endoscopy;  Laterality: N/A;   ESOPHAGOGASTRODUODENOSCOPY (EGD) WITH PROPOFOL  N/A 08/08/2021   Procedure:  ESOPHAGOGASTRODUODENOSCOPY (EGD) WITH PROPOFOL ;  Surgeon: Cindie Carlin POUR, DO;  [facility]: [facility];  Service: Endoscopy;  Laterality: N/A;   HERNIA REPAIR     left and right inguinal- Bryna Lien   MALONEY DILATION N/A 04/19/2016   Procedure: AGAPITO DILATION;  Surgeon: Lamar CHRISTELLA Hollingshead, MD;  Location: [facility];  Service: Endoscopy;  Laterality: N/A;    Home Medications:  Allergies as of 11/04/2024   No Known Allergies      Medication List        Accurate as of November 04, 2024 11:30 AM. If you have any questions, ask your nurse or doctor.          acetaminophen  325 MG tablet Commonly known as: Tylenol  Take 2 tablets (650 mg total) by mouth every 6 (six) hours as needed for moderate pain.   allopurinol  300 MG tablet Commonly known as: ZYLOPRIM  TAKE 1 TABLET BY MOUTH EVERY DAY   amLODipine-benazepril 5-10 MG capsule Commonly known as: LOTREL Take 1 capsule by mouth daily.   cetirizine  10 MG tablet Commonly known as: ZYRTEC  Take 1 tablet (10 mg total) by mouth daily.   fluticasone  50 MCG/ACT nasal spray Commonly known as: FLONASE  Place 2 sprays into both nostrils daily.   pantoprazole  40 MG tablet Commonly known as: PROTONIX  TAKE 1 TABLET BY MOUTH EVERY DAY   polyethylene glycol-electrolytes 420 g solution Commonly known as: TriLyte Take 4,000 mLs by mouth as directed.   predniSONE  50 MG tablet Commonly known as: DELTASONE  Take 1  tablet daily with breakfast for 5 days.   promethazine -dextromethorphan 6.25-15 MG/5ML syrup Commonly known as: PROMETHAZINE -DM Take 5 mLs by mouth 4 (four) times daily as needed.   SAW PALMETTO (SERENOA REPENS) PO Take 900 mg by mouth 2 (two) times daily.   silodosin  8 MG Caps capsule Commonly known as: RAPAFLO  TAKE 1 CAPSULE BY MOUTH DAILY WITH BREAKFAST.   solifenacin  5 MG tablet Commonly known as: VESICARE  Take 1 tablet (5 mg total) by mouth daily.   tiZANidine  4 MG tablet Commonly known as: Zanaflex  Take 1  tablet (4 mg total) by mouth at bedtime.        Allergies: No Known Allergies  Family History: Family History  Problem Relation Age of Onset   Anesthesia problems Neg Hx    Hypotension Neg Hx    Malignant hyperthermia Neg Hx    Pseudochol deficiency Neg Hx    Colon cancer Neg Hx     Social History:  reports that he has never smoked. He has never used smokeless tobacco. He reports that he does not drink alcohol and does not use drugs.  ROS: All other review of systems were reviewed and are negative except what is noted above in HPI  Physical Exam: BP (!) 166/78   Pulse 73   Constitutional:  Alert and oriented, No acute distress. HEENT: [place] AT, moist mucus membranes.  Trachea midline, no masses. Cardiovascular: No clubbing, cyanosis, or edema. Respiratory: Normal respiratory effort, no increased work of breathing. GI: Abdomen is soft, nontender, nondistended, no abdominal masses GU: No CVA tenderness.  Lymph: No cervical or inguinal lymphadenopathy. Skin: No rashes, bruises or suspicious lesions. Neurologic: Grossly intact, no focal deficits, moving all 4 extremities. Psychiatric: Normal mood and affect.  Laboratory Data: Lab Results  Component Value Date   WBC 9.1 04/19/2023   HGB 13.9 04/19/2023   HCT 41.7 04/19/2023   MCV 82 04/19/2023   PLT 222 04/19/2023    Lab Results  Component Value Date   CREATININE 1.28 (H) 04/19/2023    No results found for: PSA  No results found for: TESTOSTERONE  No results found for: HGBA1C  Urinalysis    Component Value Date/Time   APPEARANCEUR Clear 11/06/2023 1039   GLUCOSEU Negative 11/06/2023 1039   BILIRUBINUR Negative 11/06/2023 1039   KETONESUR trace (5) (A) 04/19/2023 1602   PROTEINUR 2+ (A) 11/06/2023 1039   UROBILINOGEN 0.2 04/19/2023 1602   NITRITE Negative 11/06/2023 1039   LEUKOCYTESUR Negative 11/06/2023 1039    Lab Results  Component Value Date   LABMICR See below: 11/06/2023   WBCUA 0-5  11/06/2023   LABEPIT 0-10 11/06/2023   MUCUS Present 11/08/2021   BACTERIA None seen 11/06/2023    Pertinent Imaging:  No results found for this or any previous visit.  No results found for this or any previous visit.  No results found for this or any previous visit.  No results found for this or any previous visit.  No results found for this or any previous visit.  No results found for this or any previous visit.  No results found for this or any previous visit.  No results found for this or any previous visit.   Assessment & Plan:    1. Elevated PSA (Primary) IsoPSA, will call with results. If abnormal we will order MRI prostate. If normal we will start finasteride 5mg  daily - Urinalysis, Routine w reflex microscopic  2. Benign localized prostatic hyperplasia with lower urinary tract symptoms (LUTS) Restart rapaflo  8mg  -  Urinalysis, Routine w reflex microscopic  3. Nocturia Rapaflo  8mg  daily   No follow-ups on file.  Belvie Clara, MD  [facility] [place]

## 2024-11-10 ENCOUNTER — Telehealth: Payer: Self-pay

## 2024-11-10 ENCOUNTER — Encounter: Payer: Self-pay | Admitting: Urology

## 2024-11-10 NOTE — Patient Instructions (Signed)

## 2025-05-20 ENCOUNTER — Other Ambulatory Visit

## 2025-05-26 ENCOUNTER — Ambulatory Visit: Admitting: Urology

## 2025-10-28 ENCOUNTER — Other Ambulatory Visit

## 2025-11-03 ENCOUNTER — Ambulatory Visit: Admitting: Urology
